# Patient Record
Sex: Male | Born: 1980 | Race: White | Hispanic: No | Marital: Single | Smoking: Never smoker
Health system: Southern US, Community
[De-identification: ages and names within clinical notes are randomized; demographics above are authoritative.]

## PROBLEM LIST (undated history)

## (undated) DIAGNOSIS — Q9389 Other deletions from the autosomes: Secondary | ICD-10-CM

## (undated) DIAGNOSIS — I890 Lymphedema, not elsewhere classified: Secondary | ICD-10-CM

## (undated) DIAGNOSIS — R4701 Aphasia: Secondary | ICD-10-CM

---

## 2012-09-13 ENCOUNTER — Emergency Department (HOSPITAL_BASED_OUTPATIENT_CLINIC_OR_DEPARTMENT_OTHER): Payer: Medicare Other

## 2012-09-13 ENCOUNTER — Emergency Department (HOSPITAL_BASED_OUTPATIENT_CLINIC_OR_DEPARTMENT_OTHER)
Admission: EM | Admit: 2012-09-13 | Discharge: 2012-09-13 | Disposition: A | Discharge status: Home / Self Care | Payer: Medicare Other | Attending: Emergency Medicine | Admitting: Emergency Medicine

## 2012-09-13 ENCOUNTER — Encounter (HOSPITAL_BASED_OUTPATIENT_CLINIC_OR_DEPARTMENT_OTHER): Payer: Self-pay | Admitting: Emergency Medicine

## 2012-09-13 DIAGNOSIS — R739 Hyperglycemia, unspecified: Secondary | ICD-10-CM

## 2012-09-13 DIAGNOSIS — R262 Difficulty in walking, not elsewhere classified: Secondary | ICD-10-CM | POA: Insufficient documentation

## 2012-09-13 DIAGNOSIS — Q9389 Other deletions from the autosomes: Secondary | ICD-10-CM | POA: Insufficient documentation

## 2012-09-13 DIAGNOSIS — R7309 Other abnormal glucose: Secondary | ICD-10-CM | POA: Insufficient documentation

## 2012-09-13 DIAGNOSIS — Q9388 Other microdeletions: Secondary | ICD-10-CM

## 2012-09-13 HISTORY — DX: Other deletions from the autosomes: Q93.89

## 2012-09-13 LAB — CBC WITH DIFFERENTIAL/PLATELET
Basophils Absolute: 0 10*3/uL (ref 0.0–0.1)
HCT: 40.5 % (ref 39.0–52.0)
Hemoglobin: 14.1 g/dL (ref 13.0–17.0)
Lymphocytes Relative: 5 % — ABNORMAL LOW (ref 12–46)
Lymphs Abs: 0.8 10*3/uL (ref 0.7–4.0)
Monocytes Absolute: 0.6 10*3/uL (ref 0.1–1.0)
Monocytes Relative: 4 % (ref 3–12)
Neutro Abs: 14.7 10*3/uL — ABNORMAL HIGH (ref 1.7–7.7)
RBC: 4.61 MIL/uL (ref 4.22–5.81)
RDW: 12.9 % (ref 11.5–15.5)
WBC: 16.1 10*3/uL — ABNORMAL HIGH (ref 4.0–10.5)

## 2012-09-13 LAB — COMPREHENSIVE METABOLIC PANEL
AST: 20 U/L (ref 0–37)
CO2: 24 mEq/L (ref 19–32)
Chloride: 101 mEq/L (ref 96–112)
Creatinine, Ser: 0.7 mg/dL (ref 0.50–1.35)
GFR calc non Af Amer: >90 mL/min (ref >90)
Glucose, Bld: 229 mg/dL — ABNORMAL HIGH (ref 70–99)
Total Bilirubin: 1 mg/dL (ref 0.3–1.2)

## 2012-09-13 LAB — CK TOTAL AND CKMB (NOT AT ARMC): Total CK: 119 U/L (ref 7–232)

## 2012-09-13 LAB — URINALYSIS, ROUTINE W REFLEX MICROSCOPIC
Bilirubin Urine: NEGATIVE
Hgb urine dipstick: NEGATIVE
Protein, ur: NEGATIVE mg/dL
Urobilinogen, UA: 0.2 mg/dL (ref 0.0–1.0)

## 2012-09-13 MED ORDER — POTASSIUM CHLORIDE 20 MEQ/15ML (10%) PO LIQD
40.0000 meq | Freq: Once | ORAL | Status: AC
  Administered 2012-09-13: 40 meq via ORAL
  Filled 2012-09-13: qty 30

## 2012-09-13 MED ORDER — ONDANSETRON 4 MG PO TBDP
4.0000 mg | ORAL_TABLET | Freq: Once | ORAL | Status: AC
  Administered 2012-09-13: 4 mg via ORAL
  Filled 2012-09-13: qty 1

## 2012-09-13 MED ORDER — ONDANSETRON 4 MG PO TBDP: 4.0000 mg | ORAL_TABLET | Freq: Three times a day (TID) | ORAL | Status: DC | PRN

## 2012-09-13 MED ORDER — MAGNESIUM SULFATE 50 % IJ SOLN: 1.0000 g | Freq: Once | INTRAMUSCULAR | Status: DC

## 2012-09-13 MED ORDER — POTASSIUM CHLORIDE CRYS ER 20 MEQ PO TBCR: 40.0000 meq | EXTENDED_RELEASE_TABLET | Freq: Once | ORAL | Status: DC

## 2012-09-13 MED ORDER — METFORMIN HCL 500 MG PO TABS: 500.0000 mg | ORAL_TABLET | Freq: Every day | ORAL | Status: DC

## 2012-09-13 MED ORDER — SODIUM CHLORIDE 0.9 % IV BOLUS (SEPSIS): 1000.0000 mL | Freq: Once | INTRAVENOUS | Status: DC

## 2012-09-13 MED ORDER — METFORMIN HCL 500 MG/5ML PO SOLN: 500.0000 mg | Freq: Every morning | ORAL | Status: DC

## 2012-09-13 NOTE — ED Notes (Signed)
MD at bedside. 

## 2012-09-13 NOTE — ED Notes (Signed)
Family at bedside attempting to get urine from pt.

## 2012-09-13 NOTE — ED Provider Notes (Signed)
History     CSN: [id number]  Arrival date & time 09/13/12  1048   First MD Initiated Contact with Patient 09/13/12 1059      Chief Complaint  Patient presents with  . 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 History is provided by patient's mother and other family members due to patient's significant deficits in expressive communication due to his chromosomal disorder. Pt's mother states that pt was pale and was having difficulty ambulating after walking out of the bathroom. Pt's mother had to "catch" patient to prevent him from falling, and states patient has been unable to ambulate independently since that time. Family members deny pt having any recent symptoms of illness, or of him complaining of being in any pain or discomfort. When asked specifically if he is in any pain, pt shakes his head in a "no" fashion.   Past Medical History  Diagnosis Date  . 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  No family history on file.  History  Substance Use Topics  . Smoking status: Never Smoker   . Smokeless tobacco: Not on file  . Alcohol Use: No      Review of Systems  All other systems reviewed and are negative.    Allergies  Review of patient's allergies indicates no known allergies.  Home Medications   Current Outpatient Rx  Name  Route  Sig  Dispense  Refill  . METFORMIN HCL 500 MG/5ML PO SOLN   Oral   Take 5 mLs (500 mg total) by mouth every morning.   300 mL   0   . ONDANSETRON 4 MG PO TBDP   Oral   Take 1 tablet (4 mg total) by mouth every 8 (eight) hours as needed for nausea.   20 tablet   0     BP 132/76  Pulse 76  Temp 98.2 F (36.8 C) (Oral)  Resp 16  SpO2 100%  Physical Exam  Constitutional: No distress.  HENT:  Head: Atraumatic.  Eyes: Pupils are equal, round,  and reactive to light. No scleral icterus.  Neck: Normal range of motion. No tracheal deviation present.  Cardiovascular: Normal rate and regular rhythm.   No murmur heard. Pulmonary/Chest: Effort normal. He has no wheezes. He has no rales.  Abdominal: Soft. Bowel sounds are normal. He exhibits no distension. There is no tenderness.  Musculoskeletal: Normal range of motion. He exhibits edema (bilateral non-pitting LE edema consistent with lymphedema).  Neurological: He is alert.       Patient unable to communicate verbally (at baseline). Strength decreased (likely due to effort) but equal in all extremities. CN II-XII intact. No focal deficits. 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   Skin: Skin is warm and dry. No rash noted.    ED Course  Procedures (including critical care time)  Labs Reviewed  CBC WITH DIFFERENTIAL - Abnormal; Notable for the following:    WBC 16.1 (*)     Neutrophils Relative 91 (*)     Neutro Abs 14.7 (*)     Lymphocytes Relative 5 (*)     All other components within normal limits  COMPREHENSIVE METABOLIC PANEL - Abnormal; Notable for the following:    Potassium 3.3 (*)     Glucose, Bld 229 (*)     All other components within normal limits  URINALYSIS, ROUTINE W REFLEX MICROSCOPIC - Abnormal; Notable for  the following:    Color, Urine AMBER (*)  BIOCHEMICALS MAY BE AFFECTED BY COLOR   Specific Gravity, Urine 1.040 (*)     Glucose, UA 500 (*)     Ketones, ur 40 (*)     All other components within normal limits  CK TOTAL AND CKMB - Abnormal; Notable for the following:    Relative Index 3.0 (*)     All other components within normal limits  HEMOGLOBIN A1C   Dg Chest 2 View  09/13/2012  *RADIOLOGY REPORT*  Clinical Data: Weakness, leukocytosis  CHEST - 2 VIEW  Comparison:  None.  Findings:  The heart size and mediastinal contours are within normal limits.  Both lungs are clear.  The visualized skeletal structures are unremarkable.  IMPRESSION: No active  cardiopulmonary disease.   Original Report Authenticated By: Judie Petit. Miles Costain, M.D.    Ct Head Wo Contrast  09/13/2012  *RADIOLOGY REPORT*  Clinical Data: Extremity weakness and decreased mobility.  CT HEAD WITHOUT CONTRAST  Technique:  Contiguous axial images were obtained from the base of the skull through the vertex without contrast.  Comparison: None.  Findings: The brain shows greater degree of atrophy than expected for age.  There is no evidence of hemorrhage, infarction, edema, mass effect, extra-axial fluid collection, hydrocephalus or mass lesion.  The skull is unremarkable.  The right maxillary antrum is opacified and a few ethmoid air cells also show opacification.  IMPRESSION:  1.  Cerebral atrophy. 2.  No acute findings. 3.  Paranasal sinus disease as above.   Original Report Authenticated By: Irish Lack, M.D.      1. Chromosome 22q13 microdeletion syndrome   2. Hyperglycemia       MDM  Pt appears to be hypovolemic secondary to likely DM, with fasting CBG = 229. Hb A1c results pending. 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 Patient vomited once after attempting to rehydrate too quickly with PO fluids, and was unable to tolerate zofran ODT. Later in ED course he was able to hold down some liquids, and was able to ambulate with minimal assistance. At that time pt was deemed appropriate for discharge with instructions to establish care and fu with a PCP within the following week. Pt was started on metformin 500mg  QD for his very likely DM, and will likely need to continue anti-hyperglycemic treatment going forward unless the results of his Hb A1c are within normal limits. Pt stable at time of discharge, and although pt has leukocytosis there are no other signs/symptoms concerning for infection, thus his leukocytosis likely more consistent with a stress response.          Elfredia Nevins, MD 09/13/12 [phone]

## 2012-09-13 NOTE — ED Notes (Signed)
Per EMS:  Pt from home, decreased mobility since this am.  Pt has severe lower extremity swelling which is baseline for pt.  Pt was leaning at home, unable to walk except with maximum assist which is new for pt.  No recent illness or injury.

## 2012-09-13 NOTE — ED Notes (Signed)
Pt removed IV after placement.

## 2012-09-14 LAB — HEMOGLOBIN A1C: Mean Plasma Glucose: 114 mg/dL (ref <117)

## 2012-09-14 NOTE — ED Provider Notes (Signed)
I saw and evaluated the patient, reviewed the resident's note and I agree with the findings and plan.   .Face to face Exam:  General:  Awake HEENT:  Atraumatic Resp:  Normal effort Abd:  Nondistended Neuro:No focal weakness Lymph: No adenopathy   Nelia Shi, MD 09/14/12 [phone]

## 2015-12-03 ENCOUNTER — Encounter (HOSPITAL_BASED_OUTPATIENT_CLINIC_OR_DEPARTMENT_OTHER): Payer: Self-pay | Admitting: *Deleted

## 2015-12-03 ENCOUNTER — Emergency Department (HOSPITAL_BASED_OUTPATIENT_CLINIC_OR_DEPARTMENT_OTHER): Payer: Medicare Other

## 2015-12-03 ENCOUNTER — Emergency Department (HOSPITAL_BASED_OUTPATIENT_CLINIC_OR_DEPARTMENT_OTHER)
Admission: EM | Admit: 2015-12-03 | Discharge: 2015-12-03 | Disposition: A | Discharge status: Home / Self Care | Payer: Medicare Other | Attending: Emergency Medicine | Admitting: Emergency Medicine

## 2015-12-03 DIAGNOSIS — R269 Unspecified abnormalities of gait and mobility: Secondary | ICD-10-CM | POA: Diagnosis not present

## 2015-12-03 DIAGNOSIS — M19031 Primary osteoarthritis, right wrist: Secondary | ICD-10-CM

## 2015-12-03 DIAGNOSIS — F419 Anxiety disorder, unspecified: Secondary | ICD-10-CM | POA: Insufficient documentation

## 2015-12-03 DIAGNOSIS — M25531 Pain in right wrist: Secondary | ICD-10-CM | POA: Diagnosis present

## 2015-12-03 DIAGNOSIS — Q9389 Other deletions from the autosomes: Secondary | ICD-10-CM | POA: Diagnosis not present

## 2015-12-03 MED ORDER — IBUPROFEN 100 MG/5ML PO SUSP
600.0000 mg | Freq: Once | ORAL | Status: AC
  Administered 2015-12-03: 600 mg via ORAL
  Filled 2015-12-03: qty 30

## 2015-12-03 NOTE — ED Provider Notes (Signed)
CSN: [id number]     Arrival date & time 12/03/15  1512 History  By signing my name below, I, Willie Ayala, attest that this documentation has been prepared under the direction and in the presence of Willie Rice, MD. Electronically Signed: Helane Ayala, ED Scribe. 12/03/2015. 4:03 PM.     Chief Complaint  Patient presents with  . Hand Injury   The history is provided by a parent. No language interpreter was used.   HPI Comments: Level 5 Caveat (pt is non-verbal) Willie Ayala is a 35 y.o. 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 Per mom, she noticed pt seemed to have difficulty using his right hand to eat, and states pt then switched to using only his left hand for eating and other tasks, which is unusual for him. She reports no other neurological symptoms or motor difficulties. She notes pt does not have a neurologist and is very healthy otherwise. She states this would be pt's first neurological issue, if it is not an injury. She notes pt has edema and an awkward gait at baseline, and states pt is very self-sufficient otherwise. She denies noticing any bruising, abnormal swelling, or other signs of trauma on the hand.   Past Medical History  Diagnosis Date  . 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 No pertinent past surgical history. History reviewed. No pertinent family history. Social History  Substance Use Topics  . Smoking status: Never Smoker   . Smokeless tobacco: None  . Alcohol Use: No    Review of Systems  Unable to perform ROS: Patient nonverbal  Constitutional: Negative for chills.  Musculoskeletal: Positive for gait problem (baseline).  Skin: Negative for color change.    Allergies  Review of patient's allergies indicates no known allergies.  Home Medications   Prior to Admission medications    Not on File   BP 129/87 mmHg  Pulse 83  Resp 20  SpO2 100% Physical Exam  Constitutional: He appears well-developed and well-nourished. No distress.  HENT:  Head: Normocephalic and atraumatic.  Mouth/Throat: Oropharynx is clear and moist.  Deep set eyes  Eyes: EOM are normal. Pupils are equal, round, and reactive to light.  Neck: Normal range of motion. Neck supple.  Cardiovascular: Normal rate and regular rhythm.   Pulmonary/Chest: Effort normal and breath sounds normal.  Abdominal: Soft. Bowel sounds are normal. There is no tenderness.  Musculoskeletal: Normal range of motion. He exhibits no edema or tenderness.  Patient is resistant to exam. He does not seem to be moving the right wrist and hand especially as the left. There is no obvious tenderness to palpation. No obvious deformity or swelling to the wrist or hand. Good distal cap refill.  Neurological: He is alert.  Patient is intermittently following simple commands. He is ambulating with abnormal gait but independently. He has good grip strength with the left hand. He is not moving the right hand  Skin: Skin is warm and dry. No rash noted. No erythema.  Psychiatric:  Anxious, poor eye contact.  Nursing note and vitals reviewed.   ED Course  Procedures  DIAGNOSTIC STUDIES: Oxygen Saturation is 100% on RA, normal by my interpretation.    COORDINATION OF CARE: 3:58 PM - Discussed plans to order diagnostic studies to r/o an injury. Parent advised of plan for treatment and  parent agrees.  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 Initial encounter. EXAM: RIGHT WRIST - COMPLETE 3+ VIEW COMPARISON:  Right hand x-rays obtained concurrently. No prior wrist imaging.  FINDINGS: Mild diffuse soft tissue swelling. No evidence of acute fracture or dislocation. Mild joint space narrowing involving the trapezium-lunate joint. Remaining joint spaces well preserved. Bone mineral density well-preserved. IMPRESSION: No acute or significant osseous abnormality. Electronically Signed   By: Willie Ayala M.D.   On: 12/03/2015 16:33   Dg Hand Complete Right  12/03/2015  CLINICAL DATA:  Possible right hand injury EXAM: RIGHT HAND - COMPLETE 3+ VIEW COMPARISON:  None. FINDINGS: No fracture or dislocation is seen. The joint spaces are preserved. The visualized soft tissues are unremarkable. IMPRESSION: No acute osseus abnormality is seen. Electronically Signed   By: Willie Ayala M.D.   On: 12/03/2015 16:30   I have personally reviewed and evaluated these images and lab results as part of my medical decision-making.   EKG Interpretation None      MDM   Final diagnoses:  Arthritis of right wrist   I personally performed the services described in this documentation, which was scribed in my presence. The recorded information has been reviewed and is accurate.   Soft tissue swelling noted on wrist x-ray as well as narrowing of the trapezium-lunate joint. Given these findings are likely musculoskeletal cause for the patient's non-use as opposed to neurologic. We'll placed in a wrist splint and referred to hand surgery. Will also start on ibuprofen. Return precautions given.  Willie Rice, MD 12/03/15 1659

## 2015-12-03 NOTE — ED Notes (Signed)
Inability to move right hand as he usually does since Wednesday.  Pt nonverbal. No redness, swelling noted.

## 2015-12-03 NOTE — Discharge Instructions (Signed)

## 2017-02-27 ENCOUNTER — Encounter (HOSPITAL_BASED_OUTPATIENT_CLINIC_OR_DEPARTMENT_OTHER): Payer: Self-pay

## 2017-02-27 ENCOUNTER — Emergency Department (HOSPITAL_BASED_OUTPATIENT_CLINIC_OR_DEPARTMENT_OTHER)
Admission: EM | Admit: 2017-02-27 | Discharge: 2017-02-27 | Disposition: A | Discharge status: Home / Self Care | Payer: Medicare Other | Attending: Emergency Medicine | Admitting: Emergency Medicine

## 2017-02-27 DIAGNOSIS — J020 Streptococcal pharyngitis: Secondary | ICD-10-CM | POA: Diagnosis not present

## 2017-02-27 DIAGNOSIS — R1310 Dysphagia, unspecified: Secondary | ICD-10-CM | POA: Diagnosis present

## 2017-02-27 HISTORY — DX: Aphasia: R47.01

## 2017-02-27 LAB — RAPID STREP SCREEN (MED CTR MEBANE ONLY): Streptococcus, Group A Screen (Direct): POSITIVE — AB

## 2017-02-27 MED ORDER — PENICILLIN V POTASSIUM 250 MG/5ML PO SOLR: 500.0000 mg | Freq: Three times a day (TID) | ORAL | 0 refills | Status: AC

## 2017-02-27 NOTE — ED Provider Notes (Signed)
[place] [facility] Provider Note   CSN: [id number] Arrival date & time: 02/27/17  1720  By signing my name below, I, Higinio Plan, attest that this documentation has been prepared under the direction and in the presence of Dorie Rank, MD . Electronically Signed: Higinio Plan, Scribe. 02/27/2017. 5:52 PM.  History   Chief Complaint No chief complaint on file.  The history is provided by the patient. No language interpreter was used.   HPI Comments: Willie Ayala is a 36 y.o. 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 Pt's mother reports pt ate his breakfast "slower than normal" this morning and did not eat his entire lunch which is abnormal for him. She notes associated subjective neck swelling described as a "golf ball around his adam's apple." Pt's mother denies any decreased appetite, shortness of breath, fever, cough, or vomiting.    Past Medical History:  Diagnosis Date  . Nonverbal   . 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  History reviewed. No pertinent surgical history.  Home Medications    Prior to Admission medications   Medication Sig Start Date End Date Taking? Authorizing Provider  penicillin v potassium (VEETID) 250 MG/5ML solution Take 10 mLs (500 mg total) by mouth 3 (three) times daily. 02/27/17 03/09/17  Dorie Rank, MD    Family History No family history on file.  Social History Social History  Substance Use Topics  . Smoking status: Never Smoker  . Smokeless tobacco: Never Used  . Alcohol use No   Allergies   Patient has no known allergies.  Review of Systems Review of Systems  Constitutional: Negative for appetite change and fever.  HENT: Positive for trouble swallowing.   Respiratory: Negative for cough and shortness of breath.   Gastrointestinal: Negative for  vomiting.  Musculoskeletal:       +subjective neck swelling   Physical Exam Updated Vital Signs BP 126/85 (BP Location: Left Arm)   Pulse 80   Temp 98.6 F (37 C) (Oral)   Resp 16   Wt 131 lb (59.4 kg)   SpO2 100%   Physical Exam  Constitutional: He appears well-nourished. No distress.  HENT:  Head: Normocephalic and atraumatic.  Right Ear: Tympanic membrane and external ear normal.  Left Ear: Tympanic membrane and external ear normal.  Mouth/Throat: Oropharynx is clear and moist. No oropharyngeal exudate.  Difficult to get pt to completely open his mouth, view is somewhat limited. No stridor, no difficulty handling his secretions   Eyes: Conjunctivae are normal. Right eye exhibits no discharge. Left eye exhibits no discharge. No scleral icterus.  Neck: Neck supple. No tracheal deviation present.  Prominent tracheal cartilage without erythema or edema, no tenderness  Cardiovascular: Normal rate.   Pulmonary/Chest: Effort normal. No stridor. No respiratory distress.  Abdominal: He exhibits no distension.  Musculoskeletal: He exhibits no edema.  Neurological: He is alert. Cranial nerve deficit: no gross deficits.  Skin: Skin is warm and dry. No rash noted.  Psychiatric: He has a normal mood and affect.  Nursing note and vitals reviewed.  ED Treatments / Results  DIAGNOSTIC STUDIES:  Oxygen Saturation is 100% on RA, normal by my interpretation.    COORDINATION OF CARE:  5:47 PM Discussed treatment plan with pt and his mother at bedside and they agreed to plan.  Labs (all labs  ordered are listed, but only abnormal results are displayed) Labs Reviewed  RAPID STREP SCREEN (NOT AT [facility]) - Abnormal; Notable for the following:       Result Value   Streptococcus, Group A Screen (Direct) POSITIVE (*)    All other components within normal limits   Radiology No results found.  Procedures Procedures (including critical care time)  Medications Ordered in ED Medications - No  data to display  Initial Impression / Assessment and Plan / ED Course  I have reviewed the triage vital signs and the nursing notes.  Pertinent labs & imaging results that were available during my care of the patient were reviewed by me and considered in my medical decision making (see chart for details).   Strep test positive.  Pt does not appear to be in any distress.  Will rx penicillin po  Final Clinical Impressions(s) / ED Diagnoses   Final diagnoses:  Strep throat    New Prescriptions New Prescriptions   PENICILLIN V POTASSIUM (VEETID) 250 MG/5ML SOLUTION    Take 10 mLs (500 mg total) by mouth 3 (three) times daily.   I personally performed the services described in this documentation, which was scribed in my presence.  The recorded information has been reviewed and is accurate.    Dorie Rank, MD 02/27/17 [phone]

## 2017-02-27 NOTE — ED Notes (Signed)
ED Provider at bedside. 

## 2017-02-27 NOTE — ED Triage Notes (Signed)
Mother reports pt with slow to swallow food today and she feels there is "swelling to adam's apple"-denies injury-pt nonverbal-NAD-slow steady gait

## 2017-02-27 NOTE — Discharge Instructions (Signed)
Take antibiotics as prescribed, follow-up with his doctor if the symptoms have not resolved after the course of antibiotics

## 2017-03-16 ENCOUNTER — Emergency Department (HOSPITAL_BASED_OUTPATIENT_CLINIC_OR_DEPARTMENT_OTHER)
Admission: EM | Admit: 2017-03-16 | Discharge: 2017-03-16 | Disposition: A | Discharge status: Home / Self Care | Payer: Medicare Other | Attending: Emergency Medicine | Admitting: Emergency Medicine

## 2017-03-16 ENCOUNTER — Encounter (HOSPITAL_BASED_OUTPATIENT_CLINIC_OR_DEPARTMENT_OTHER): Payer: Self-pay | Admitting: Emergency Medicine

## 2017-03-16 ENCOUNTER — Emergency Department (HOSPITAL_BASED_OUTPATIENT_CLINIC_OR_DEPARTMENT_OTHER): Payer: Medicare Other

## 2017-03-16 DIAGNOSIS — K219 Gastro-esophageal reflux disease without esophagitis: Secondary | ICD-10-CM | POA: Insufficient documentation

## 2017-03-16 DIAGNOSIS — Q932 Chromosome replaced with ring, dicentric or isochromosome: Secondary | ICD-10-CM | POA: Insufficient documentation

## 2017-03-16 DIAGNOSIS — R4182 Altered mental status, unspecified: Secondary | ICD-10-CM | POA: Diagnosis present

## 2017-03-16 LAB — CBC WITH DIFFERENTIAL/PLATELET
Basophils Absolute: 0 10*3/uL (ref 0.0–0.1)
Basophils Relative: 0 %
EOS ABS: 0.1 10*3/uL (ref 0.0–0.7)
EOS PCT: 1 %
HCT: 44 % (ref 39.0–52.0)
Hemoglobin: 14.7 g/dL (ref 13.0–17.0)
LYMPHS ABS: 1.4 10*3/uL (ref 0.7–4.0)
Lymphocytes Relative: 14 %
MCH: 31.4 pg (ref 26.0–34.0)
MCHC: 33.4 g/dL (ref 30.0–36.0)
MCV: 94 fL (ref 78.0–100.0)
MONO ABS: 1 10*3/uL (ref 0.1–1.0)
MONOS PCT: 10 %
NEUTROS PCT: 75 %
Neutro Abs: 7.5 10*3/uL (ref 1.7–7.7)
PLATELETS: 257 10*3/uL (ref 150–400)
RBC: 4.68 MIL/uL (ref 4.22–5.81)
RDW: 13.4 % (ref 11.5–15.5)
WBC: 10 10*3/uL (ref 4.0–10.5)

## 2017-03-16 LAB — BASIC METABOLIC PANEL
Anion gap: 10 (ref 5–15)
BUN: 21 mg/dL — AB (ref 6–20)
CALCIUM: 9.8 mg/dL (ref 8.9–10.3)
CO2: 26 mmol/L (ref 22–32)
CREATININE: 0.81 mg/dL (ref 0.61–1.24)
Chloride: 107 mmol/L (ref 101–111)
GFR calc non Af Amer: >60 mL/min (ref >60)
GLUCOSE: 97 mg/dL (ref 65–99)
Potassium: 3.9 mmol/L (ref 3.5–5.1)
Sodium: 143 mmol/L (ref 135–145)

## 2017-03-16 MED ORDER — SODIUM CHLORIDE 0.9 % IV BOLUS (SEPSIS)
500.0000 mL | Freq: Once | INTRAVENOUS | Status: AC
  Administered 2017-03-16: 500 mL via INTRAVENOUS

## 2017-03-16 NOTE — ED Provider Notes (Signed)
[place] [facility] Provider Note   CSN: [id number] Arrival date & time: 03/16/17  1555   By signing my name below, I, Mayer Masker, attest that this documentation has been prepared under the direction and in the presence of Yazeed Pryer PA-C. Electronically Signed: Mayer Masker, Scribe. 03/16/17. 4:45 PM.  History   Chief Complaint Chief Complaint  Patient presents with  . Altered Mental Status   The history is provided by a parent. No language interpreter was used.   HPI Comments: Rosbel Buckner is a 36 y.o. 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 Pt is nonverbal, which is his baseline. His mother states he has associated sweating and hyperactivity which he does experience intermittently, but she states that this time it lasts longer than usual. She notes he was acting strangely which includes pacing, yelling, and burping and feeling like he is, "bring something up." She denies cough, changes to his appetite, diarrhea, constipation, vomiting, or fever. She notes he had a recent diagnosis of strep throat for which he was prescribed penicillin. He finished his penicillin prescription last weekend. He is not on any medications and denies other health issues.  Past Medical History:  Diagnosis Date  . Nonverbal   . 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   History reviewed. No pertinent surgical history.     Home Medications    Prior to Admission medications   Not on File    Family History No family history on file.  Social History Social History  Substance Use Topics  . Smoking status: Never Smoker  . Smokeless tobacco: Never Used  . Alcohol use No     Allergies   Patient has no known allergies.   Review of Systems Review of Systems  Constitutional: Positive for diaphoresis. Negative  for appetite change and fever.  HENT: Negative for drooling.   Respiratory: Positive for cough.   Gastrointestinal: Negative for constipation, diarrhea and vomiting.  Genitourinary: Negative for hematuria and urgency.  Neurological: Negative for syncope.  Psychiatric/Behavioral: Positive for behavioral problems. The patient is hyperactive.      Physical Exam Updated Vital Signs BP (!) 155/102 (BP Location: Right Arm)   Pulse 97   Temp 99.3 F (37.4 C) (Oral)   Resp 18   SpO2 100%   Physical Exam  Constitutional: He appears well-developed and well-nourished. No distress.  Patient is nonverbal. He appears comfortable and pleasant.  HENT:  Head: Normocephalic and atraumatic.  Right Ear: Tympanic membrane normal.  Left Ear: Tympanic membrane normal.  Nose: Nose normal.  Mouth/Throat: Uvula is midline, oropharynx is clear and moist and mucous membranes are normal.  Eyes: Conjunctivae and EOM are normal. Right eye exhibits no discharge. Left eye exhibits no discharge. No scleral icterus.  Neck: Normal range of motion. Neck supple.  Cardiovascular: Normal rate, regular rhythm, normal heart sounds and intact distal pulses.  Exam reveals no gallop and no friction rub.   No murmur heard. Pulmonary/Chest: Effort normal and breath sounds normal. No respiratory distress.  Abdominal: Soft. Bowel sounds are normal. He exhibits no distension. There is no tenderness. There is no guarding.  Neurological: He is alert.  Skin: Skin is warm and dry. No rash noted.  Psychiatric: He has a normal mood and affect.  Nursing note and vitals reviewed.    ED Treatments /  Results  DIAGNOSTIC STUDIES: Oxygen Saturation is 100% on RA, normal by my interpretation.    COORDINATION OF CARE: 4:42 PM Discussed treatment plan with pt at bedside and pt agreed to plan.  Labs (all labs ordered are listed, but only abnormal results are displayed) Labs Reviewed  BASIC METABOLIC PANEL - Abnormal; Notable for  the following:       Result Value   BUN 21 (*)    All other components within normal limits  CBC WITH DIFFERENTIAL/PLATELET  URINALYSIS, ROUTINE W REFLEX MICROSCOPIC    EKG  EKG Interpretation None       Radiology Dg Chest 2 View  Result Date: 03/16/2017 CLINICAL DATA:  Chronic belching for 2 weeks. EXAM: CHEST  2 VIEW COMPARISON:  09/13/2012 FINDINGS: The heart size and mediastinal contours are within normal limits. Both lungs are clear. The visualized skeletal structures are unremarkable. IMPRESSION: No active cardiopulmonary disease. Electronically Signed   By: Kathreen Devoid   On: 03/16/2017 17:11    Procedures Procedures (including critical care time)  Medications Ordered in ED Medications  sodium chloride 0.9 % bolus 500 mL (500 mLs Intravenous New Bag/Given 03/16/17 1836)     Initial Impression / Assessment and Plan / ED Course  I have reviewed the triage vital signs and the nursing notes.  Pertinent labs & imaging results that were available during my care of the patient were reviewed by me and considered in my medical decision making (see chart for details).     Patient presents with complaints of "not acting like himself" as he is burping, yelling, pacing which is normal for him but does not usually last this long. No significant abnormalities noted on physical exam. Vital signs normal, and patient does not appear in respiratory distress and is tolerating secretions. Chest x-ray was obtained and showed no active cardiopulmonary disease. CBC and BMP unremarkable today. Patient is not complaining of any urinary symptoms so urinalysis was canceled. Symptoms could be due to reflux as patient states it is worse at night and he is uncomfortable when he sleeps. No other complaints noted at this time. Patient appears stable for discharge. Advised to take Maalox or probiotic as needed and follow-up with PCP. Should return precautions given.  Final Clinical Impressions(s) / ED  Diagnoses   Final diagnoses:  Gastroesophageal reflux disease without esophagitis    New Prescriptions New Prescriptions   No medications on file   I personally performed the services described in this documentation, which was scribed in my presence. The recorded information has been reviewed and is accurate.     Delia Heady, PA-C 03/16/17 Achille Rich, MD 03/16/17 [phone]

## 2017-03-16 NOTE — Discharge Instructions (Signed)
Take Maalox and probiotic as directed. Follow-up with PCP for further evaluation. Return to ED for worsening symptoms, trouble breathing, trouble swallowing, fever, vomiting.

## 2017-03-16 NOTE — ED Triage Notes (Signed)
Pt is nonverbal. Presents with mother who states he is acting "strange". He was recently tx for strep throat. He has been diaphoretic and yelling out, he did not sleep well last night. Pt ate and drank normally today, urinated well today.

## 2017-03-16 NOTE — ED Notes (Signed)
Pt unable to provide urine after several attempts. PA/MD aware. Ok to d/c pt.

## 2017-03-16 NOTE — ED Notes (Signed)
Pt's mother given d/c instructions as per chart. Verbalizes understanding. No questions. 

## 2017-03-16 NOTE — ED Notes (Signed)
Pt's mother also states he has had increased "burping" since he has been altered. The altered behavior has been happening late at night.

## 2017-03-31 ENCOUNTER — Emergency Department (HOSPITAL_BASED_OUTPATIENT_CLINIC_OR_DEPARTMENT_OTHER)
Admission: EM | Admit: 2017-03-31 | Discharge: 2017-03-31 | Disposition: A | Discharge status: Home / Self Care | Payer: Medicare Other | Attending: Emergency Medicine | Admitting: Emergency Medicine

## 2017-03-31 ENCOUNTER — Emergency Department (HOSPITAL_BASED_OUTPATIENT_CLINIC_OR_DEPARTMENT_OTHER): Payer: Medicare Other

## 2017-03-31 ENCOUNTER — Encounter (HOSPITAL_BASED_OUTPATIENT_CLINIC_OR_DEPARTMENT_OTHER): Payer: Self-pay | Admitting: Emergency Medicine

## 2017-03-31 DIAGNOSIS — R2242 Localized swelling, mass and lump, left lower limb: Secondary | ICD-10-CM | POA: Insufficient documentation

## 2017-03-31 DIAGNOSIS — M7989 Other specified soft tissue disorders: Secondary | ICD-10-CM

## 2017-03-31 LAB — CBC WITH DIFFERENTIAL/PLATELET
BASOS PCT: 0 %
Basophils Absolute: 0 10*3/uL (ref 0.0–0.1)
EOS ABS: 0.2 10*3/uL (ref 0.0–0.7)
EOS PCT: 2 %
HCT: 40.3 % (ref 39.0–52.0)
Hemoglobin: 13.3 g/dL (ref 13.0–17.0)
LYMPHS ABS: 1.5 10*3/uL (ref 0.7–4.0)
LYMPHS PCT: 15 %
MCH: 31.2 pg (ref 26.0–34.0)
MCHC: 33 g/dL (ref 30.0–36.0)
MCV: 94.6 fL (ref 78.0–100.0)
MONOS PCT: 12 %
Monocytes Absolute: 1.1 10*3/uL — ABNORMAL HIGH (ref 0.1–1.0)
NEUTROS PCT: 71 %
Neutro Abs: 6.8 10*3/uL (ref 1.7–7.7)
PLATELETS: 210 10*3/uL (ref 150–400)
RBC: 4.26 MIL/uL (ref 4.22–5.81)
RDW: 13.1 % (ref 11.5–15.5)
WBC: 9.6 10*3/uL (ref 4.0–10.5)

## 2017-03-31 LAB — COMPREHENSIVE METABOLIC PANEL
ALBUMIN: 3.7 g/dL (ref 3.5–5.0)
ALT: 28 U/L (ref 17–63)
AST: 36 U/L (ref 15–41)
Alkaline Phosphatase: 49 U/L (ref 38–126)
Anion gap: 7 (ref 5–15)
BUN: 22 mg/dL — ABNORMAL HIGH (ref 6–20)
CHLORIDE: 101 mmol/L (ref 101–111)
CO2: 29 mmol/L (ref 22–32)
Calcium: 9.1 mg/dL (ref 8.9–10.3)
Creatinine, Ser: 0.74 mg/dL (ref 0.61–1.24)
GFR calc Af Amer: >60 mL/min (ref >60)
Glucose, Bld: 88 mg/dL (ref 65–99)
POTASSIUM: 4.1 mmol/L (ref 3.5–5.1)
Sodium: 137 mmol/L (ref 135–145)
Total Bilirubin: 3 mg/dL — ABNORMAL HIGH (ref 0.3–1.2)
Total Protein: 6.7 g/dL (ref 6.5–8.1)

## 2017-03-31 MED ORDER — CEPHALEXIN 500 MG PO CAPS: 500.0000 mg | ORAL_CAPSULE | Freq: Four times a day (QID) | ORAL | 0 refills | Status: DC

## 2017-03-31 NOTE — ED Notes (Signed)
Pt transported to US/XR

## 2017-03-31 NOTE — ED Triage Notes (Addendum)
Pt is non verbal. L leg swelling and redness, hot to touch per mother. Pt normally has swelling to R leg. This is new per mother.

## 2017-03-31 NOTE — ED Provider Notes (Signed)
[facility] DEPT [facility] Provider Note   CSN: [id number] Arrival date & time: 03/31/17  1344     History   Chief Complaint Chief Complaint  Patient presents with  . Leg Swelling    HPI Willie Ayala is a 36 y.o. male.  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 Swelling is been going on for about a week. Noted to be red and hot this morning. Denies fevers but even when infected usually does not have a fever. The patient is chronically aphasic. Level V caveat aphasia.  Per the mother he has lymphedema to the right lower extremity but never had it to the left. Started having significant swelling to the left starting about a week. Has been pacing at night 7 little bit more irritable. She thinks he likely has been having pain that is hard to discern. Was recently diagnosed with strep throat a couple weeks ago. The mother is been looking at possible etiologies on the Internet and thinks that maybe he has lymphangitis or a poststreptococcal syndrome.   The history is provided by the patient.  Illness  This is a new problem. The current episode started less than 1 hour ago. The problem occurs constantly. The problem has not changed since onset.Pertinent negatives include no chest pain, no abdominal pain, no headaches and no shortness of breath. Nothing aggravates the symptoms. Nothing relieves the symptoms. He has tried nothing for the symptoms. The treatment provided no relief.    Past Medical History:  Diagnosis Date  . Nonverbal   . 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   History reviewed. No pertinent surgical history.     Home Medications    Prior to Admission medications   Medication Sig Start Date End Date Taking? Authorizing Provider  cephALEXin (KEFLEX) 500 MG capsule Take 1 capsule (500 mg total) by mouth  4 (four) times daily. 03/31/17   Deno Etienne, DO    Family History No family history on file.  Social History Social History  Substance Use Topics  . Smoking status: Never Smoker  . Smokeless tobacco: Never Used  . Alcohol use No     Allergies   Patient has no known allergies.   Review of Systems Review of Systems  Constitutional: Negative for chills and fever.  HENT: Negative for congestion and facial swelling.   Eyes: Negative for discharge and visual disturbance.  Respiratory: Negative for shortness of breath.   Cardiovascular: Negative for chest pain and palpitations.  Gastrointestinal: Negative for abdominal pain, diarrhea and vomiting.  Musculoskeletal: Negative for arthralgias and myalgias.  Skin: Positive for color change and rash.  Neurological: Negative for tremors, syncope and headaches.  Psychiatric/Behavioral: Negative for confusion and dysphoric mood.     Physical Exam Updated Vital Signs BP 139/81 (BP Location: Right Arm)   Pulse 74   Temp 99.1 F (37.3 C) (Oral)   Resp 18   SpO2 98%   Physical Exam  Constitutional: He appears well-developed and well-nourished.  HENT:  Head: Normocephalic and atraumatic.  Eyes: EOM are normal. Pupils are equal, round, and reactive to light.  Neck: Normal range of motion. Neck supple. No JVD present.  Cardiovascular: Normal rate and regular rhythm.  Exam reveals no gallop and no friction rub.   No murmur heard. Pulmonary/Chest: No respiratory distress. He has no  wheezes.  Abdominal: He exhibits no distension and no mass. There is no tenderness. There is no rebound and no guarding.  Musculoskeletal: Normal range of motion. He exhibits edema. He exhibits no tenderness.  Bilateral likely lymphedema. Erythema to the left lower extremity below the knee. 4+ pitting up to the lower thigh.  Neurological: He is alert.  Skin: No rash noted. No pallor.  Nursing note and vitals reviewed.    ED Treatments / Results   Labs (all labs ordered are listed, but only abnormal results are displayed) Labs Reviewed  CBC WITH DIFFERENTIAL/PLATELET - Abnormal; Notable for the following:       Result Value   Monocytes Absolute 1.1 (*)    All other components within normal limits  COMPREHENSIVE METABOLIC PANEL - Abnormal; Notable for the following:    BUN 22 (*)    Total Bilirubin 3.0 (*)    All other components within normal limits  URINALYSIS, ROUTINE W REFLEX MICROSCOPIC    EKG  EKG Interpretation None       Radiology No results found.  Procedures Procedures (including critical care time)  Medications Ordered in ED Medications - No data to display   Initial Impression / Assessment and Plan / ED Course  I have reviewed the triage vital signs and the nursing notes.  Pertinent labs & imaging results that were available during my care of the patient were reviewed by me and considered in my medical decision making (see chart for details).     36 yo M With a chief complaint of left lower extremity pain and swelling. Warm to touch compared to the right. I suspect this is cellulitis. However it's difficult to obtain any history from the patient. Will obtain a plain film to rule out fracture as well as a DVT study and labs.  Labs with a total bili of 3 otherwise unremarkable. Awaiting imaging studies.  If negative will likely be discharged. Will start on Keflex for possible cellulitis.  The patients results and plan were reviewed and discussed.   Any x-rays performed were independently reviewed by myself.   Differential diagnosis were considered with the presenting HPI.  Medications - No data to display  Vitals:   03/31/17 1351  BP: 139/81  Pulse: 74  Resp: 18  Temp: 99.1 F (37.3 C)  TempSrc: Oral  SpO2: 98%    Final diagnoses:  Leg swelling       Final Clinical Impressions(s) / ED Diagnoses   Final diagnoses:  Leg swelling    New Prescriptions New Prescriptions    CEPHALEXIN (KEFLEX) 500 MG CAPSULE    Take 1 capsule (500 mg total) by mouth 4 (four) times daily.     Deno Etienne, DO 03/31/17 [phone]

## 2017-03-31 NOTE — Discharge Instructions (Addendum)
You have been seen today in the Emergency Department (ED) for cellulitis, a superficial skin infection. Please take your antibiotics as prescribed for their ENTIRE prescribed duration.  Take Tylenol or Motrin as needed for pain, but only as written on the box.  ° °Please follow up with your doctor or in the ED in 24-48 hours for recheck of your infection if you are not improving.  Call your doctor sooner or return to the ED if you develop worsening signs of infection such as: increased redness, increased pain, pus, fever, or other symptoms that concern you. ° °

## 2017-03-31 NOTE — ED Provider Notes (Signed)
Blood pressure 139/81, pulse 74, temperature 99.1 F (37.3 C), temperature source Oral, resp. rate 18, SpO2 98 %.  Assuming care from Dr. Adela LankFloyd.  In short, Willie Ayala is a 36 y.o. male with a chief complaint of Leg Swelling .  Refer to the original H&P for additional details.  The current plan of care is to f/u leg imaging and reassess. Would consider keflex if no DVT or underlying bony abnormality.  05:15 PM No DVT or bony abnormality on imaging. Plan for keflex and PCP follow up this week.   At this time, I do not feel there is any life-threatening condition present. I have reviewed and discussed all results (EKG, imaging, lab, urine as appropriate), exam findings with patient. I have reviewed nursing notes and appropriate previous records.  I feel the patient is safe to be discharged home without further emergent workup. Discussed usual and customary return precautions. Patient and family (if present) verbalize understanding and are comfortable with this plan.  Patient will follow-up with their primary care provider. If they do not have a primary care provider, information for follow-up has been provided to them. All questions have been answered.  Alona BeneJoshua Long, MD    Maia PlanLong, Joshua G, MD 03/31/17 [phone]

## 2017-04-12 ENCOUNTER — Inpatient Hospital Stay (HOSPITAL_BASED_OUTPATIENT_CLINIC_OR_DEPARTMENT_OTHER)
Admission: EM | Admit: 2017-04-12 | Discharge: 2017-04-14 | DRG: 603 | Disposition: A | Discharge status: Home / Self Care | Payer: Medicare Other | Attending: Internal Medicine | Admitting: Internal Medicine

## 2017-04-12 ENCOUNTER — Encounter (HOSPITAL_BASED_OUTPATIENT_CLINIC_OR_DEPARTMENT_OTHER): Payer: Self-pay

## 2017-04-12 DIAGNOSIS — L03116 Cellulitis of left lower limb: Principal | ICD-10-CM | POA: Diagnosis present

## 2017-04-12 DIAGNOSIS — R739 Hyperglycemia, unspecified: Secondary | ICD-10-CM | POA: Diagnosis present

## 2017-04-12 DIAGNOSIS — Q932 Chromosome replaced with ring, dicentric or isochromosome: Secondary | ICD-10-CM | POA: Diagnosis not present

## 2017-04-12 DIAGNOSIS — Q9389 Other deletions from the autosomes: Secondary | ICD-10-CM

## 2017-04-12 LAB — CBC
HCT: 40.8 % (ref 39.0–52.0)
Hemoglobin: 13.6 g/dL (ref 13.0–17.0)
MCH: 31 pg (ref 26.0–34.0)
MCHC: 33.3 g/dL (ref 30.0–36.0)
MCV: 92.9 fL (ref 78.0–100.0)
Platelets: 295 10*3/uL (ref 150–400)
RBC: 4.39 MIL/uL (ref 4.22–5.81)
RDW: 13.3 % (ref 11.5–15.5)
WBC: 9 10*3/uL (ref 4.0–10.5)

## 2017-04-12 LAB — BASIC METABOLIC PANEL
Anion gap: 10 (ref 5–15)
BUN: 21 mg/dL — AB (ref 6–20)
CO2: 26 mmol/L (ref 22–32)
CREATININE: 0.69 mg/dL (ref 0.61–1.24)
Calcium: 8.8 mg/dL — ABNORMAL LOW (ref 8.9–10.3)
Chloride: 103 mmol/L (ref 101–111)
GFR calc Af Amer: >60 mL/min (ref >60)
GLUCOSE: 180 mg/dL — AB (ref 65–99)
Potassium: 3.9 mmol/L (ref 3.5–5.1)
SODIUM: 139 mmol/L (ref 135–145)

## 2017-04-12 LAB — I-STAT CG4 LACTIC ACID, ED
LACTIC ACID, VENOUS: 2.2 mmol/L — AB (ref 0.5–1.9)
Lactic Acid, Venous: 0.69 mmol/L (ref 0.5–1.9)

## 2017-04-12 MED ORDER — ENOXAPARIN SODIUM 40 MG/0.4ML ~~LOC~~ SOLN
40.0000 mg | Freq: Every day | SUBCUTANEOUS | Status: DC
  Administered 2017-04-13 (×2): 40 mg via SUBCUTANEOUS
  Filled 2017-04-12 (×2): qty 0.4

## 2017-04-12 MED ORDER — SODIUM CHLORIDE 0.9 % IV BOLUS (SEPSIS)
1000.0000 mL | Freq: Once | INTRAVENOUS | Status: AC
  Administered 2017-04-12: 1000 mL via INTRAVENOUS

## 2017-04-12 MED ORDER — SODIUM CHLORIDE 0.45 % IV SOLN
INTRAVENOUS | Status: DC
  Administered 2017-04-12: 1000 mL via INTRAVENOUS
  Administered 2017-04-13: 09:00:00 via INTRAVENOUS

## 2017-04-12 MED ORDER — INSULIN ASPART 100 UNIT/ML ~~LOC~~ SOLN: 0.0000 [IU] | Freq: Three times a day (TID) | SUBCUTANEOUS | Status: DC

## 2017-04-12 MED ORDER — VANCOMYCIN HCL 500 MG IV SOLR
INTRAVENOUS | Status: AC
  Administered 2017-04-12: 1250 mg
  Filled 2017-04-12: qty 1500

## 2017-04-12 MED ORDER — VANCOMYCIN HCL IN DEXTROSE 1-5 GM/200ML-% IV SOLN
1000.0000 mg | Freq: Two times a day (BID) | INTRAVENOUS | Status: DC
Start: 2017-04-13 — End: 2017-04-14
  Administered 2017-04-13 – 2017-04-14 (×3): 1000 mg via INTRAVENOUS
  Filled 2017-04-12 (×3): qty 200

## 2017-04-12 MED ORDER — VANCOMYCIN HCL 10 G IV SOLR
1250.0000 mg | Freq: Once | INTRAVENOUS | Status: AC
  Administered 2017-04-12: 1250 mg via INTRAVENOUS
  Filled 2017-04-12: qty 1250

## 2017-04-12 MED ORDER — ACETAMINOPHEN 160 MG/5ML PO SOLN
650.0000 mg | Freq: Once | ORAL | Status: AC
  Administered 2017-04-12: 650 mg via ORAL
  Filled 2017-04-12: qty 20.3

## 2017-04-12 NOTE — ED Notes (Signed)
ED Provider at bedside. 

## 2017-04-12 NOTE — H&P (Signed)
History and Physical    Volney Reierson ZOX:[id number] DOB: 12/14/1980 DOA: 04/12/2017  PCP: Patient, No Pcp Per   Patient coming from: Home.  I have personally briefly reviewed patient's old medical records in [place]  Chief Complaint: Left LE swelling.  HPI: Geral Coker is a 36 y.o. male with medical history significant of ring chromosome 22 syndrome who was taken to [facility] for evaluation left LE edema and erythema for about 12 days which has not responded to oral antibiotic therapy with cephalexin. His mother states that he has been having fever at home. He is non-verbal and unable to provide further history.   ED Course: VS were temp 100.33F, 96 bpm, 18 bpm, 140/93 mmHg and O2 sat 100%. The patient received a NS bolus in the ED and IV vancomycin. WBC was 9.0, Hb 13.6 gr/dL and platelets 295. Sodium 139, potassium 3.9, chloride 103 and bicarbonate 26 mmol/L. BUN was 21, creatinine 0.69 and glucose 180 mg/dL. Lactic acid initially was 2.20, then 0.69 mmol/L after IV fluids.He had a negative for DVT duplex 12 days ago.  Review of Systems: As per HPI otherwise 10 point review of systems negative.    Past Medical History:  Diagnosis Date  . Nonverbal   . 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 No pertinent surgical history.   reports that he has never smoked. He has never used smokeless tobacco. He reports that he does not drink alcohol or use drugs.  No Known Allergies  Family History  Problem Relation Age of Onset  . Parkinson's disease Father   . Cancer Maternal Grandmother   . Cancer Paternal Grandfather   . Hypertension Paternal Grandfather     Prior to Admission medications   Not on File    Physical Exam: Vitals:   04/12/17 2000 04/12/17 2030 04/12/17 2100 04/12/17 2150  BP: (!) 137/92 136/88 (!) 146/99 130/79  Pulse: 85 86 90 95  Resp: _0 Temp:    99.6 F (37.6 C)  TempSrc:    Oral  SpO2: 98% 97% 99% 98%  Weight:        Constitutional:  NAD, calm, comfortable Eyes: PERRL, lids and conjunctivae normal ENMT: Mucous membranes are moist. Posterior pharynx clear of any exudate or lesions. Neck: Normal, supple, no masses, no thyromegaly Respiratory: Clear to auscultation bilaterally, no wheezing, no crackles. Normal respiratory effort. No accessory muscle use.  Cardiovascular: Regular rate and rhythm, no murmurs / rubs / gallops. + lymphedema. 2+ pedal pulses. No carotid bruits.  Abdomen: Soft, no tenderness, no masses palpated. No hepatosplenomegaly. Bowel sounds positive.  Musculoskeletal: no clubbing / cyanosis. Decreased ROM on lower extremities, no contractures. Normal muscle tone.  Skin: Bilateral lower extremities lymphedema. Left lower extremity pretibial erythema Neurologic: CN 2-12 grossly intact. Sensation intact, DTR normal. Strength 5/5 in all 4.  Psychiatric: Normal judgment and insight. Alert and oriented x 4. Normal mood.    Labs on Admission: I have personally reviewed following labs and imaging studies  CBC:  Recent Labs Lab 04/12/17 1741  WBC 9.0  HGB 13.6  HCT 40.8  MCV 92.9  PLT 811   Basic Metabolic Panel:  Recent Labs Lab 04/12/17 1741  NA 139  K 3.9  CL 103  CO2 26  GLUCOSE 180*  BUN 21*  CREATININE 0.69  CALCIUM 8.8*   GFR: CrCl cannot be calculated (Unknown ideal weight.). Liver Function Tests: No results for input(s): AST, ALT, ALKPHOS, BILITOT, PROT, ALBUMIN  in the last 168 hours. No results for input(s): LIPASE, AMYLASE in the last 168 hours. No results for input(s): AMMONIA in the last 168 hours. Coagulation Profile: No results for input(s): INR, PROTIME in the last 168 hours. Cardiac Enzymes: No results for input(s): CKTOTAL, CKMB, CKMBINDEX, TROPONINI in the last 168 hours. BNP (last 3 results) No results for input(s): PROBNP in the last 8760 hours. HbA1C: No results for input(s): HGBA1C in the last 72 hours. CBG: No results for input(s): GLUCAP in the last 168  hours. Lipid Profile: No results for input(s): CHOL, HDL, LDLCALC, TRIG, CHOLHDL, LDLDIRECT in the last 72 hours. Thyroid Function Tests: No results for input(s): TSH, T4TOTAL, FREET4, T3FREE, THYROIDAB in the last 72 hours. Anemia Panel: No results for input(s): VITAMINB12, FOLATE, FERRITIN, TIBC, IRON, RETICCTPCT in the last 72 hours. Urine analysis:    Component Value Date/Time   COLORURINE AMBER (A) 09/13/2012 1340   APPEARANCEUR CLEAR 09/13/2012 1340   LABSPEC 1.040 (H) 09/13/2012 1340   PHURINE 5.5 09/13/2012 1340   GLUCOSEU 500 (A) 09/13/2012 1340   HGBUR NEGATIVE 09/13/2012 1340   BILIRUBINUR NEGATIVE 09/13/2012 1340   KETONESUR 40 (A) 09/13/2012 1340   PROTEINUR NEGATIVE 09/13/2012 1340   UROBILINOGEN 0.2 09/13/2012 1340   NITRITE NEGATIVE 09/13/2012 1340   LEUKOCYTESUR NEGATIVE 09/13/2012 1340    Radiological Exams on Admission: No results found.  EKG: Independently reviewed.   Assessment/Plan Principal Problem:   Cellulitis of left lower extremity Admit to MedSurg. Continue analgesics as needed. Continue Vancomycin per pharmacy. Follow up BC and sensitivity.  Consider repeating LE duplex if no improvement.  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    Hyperglycemia Carbohydrate modified diet. CBG monitoring with RI sliding scale. Check hemoglobin A1c in AM.    DVT prophylaxis: Lovenox SQ. Code Status: Full code. Family Communication: His mother was present in his room. Disposition Plan: Admit for IV antibiotics for 2-3 days. Consults called:  Admission status: Inpatient/MedSurg   Reubin Milan MD [facility] Pager [phone]  If 7PM-7AM, please contact night-coverage [email or web address] Password [facility]  04/12/2017, 11:06 PM

## 2017-04-12 NOTE — Progress Notes (Signed)
Pharmacy Antibiotic Note Willie BurlyDavid Ayala is a 36 y.o. male admitted on 04/12/2017 with persistent L lower leg swelling. Pt recently finished course of Keflex without improvement. WBC wnl, SCr 0.69, LA 2.2, afebrile.   Plan: -Vancomycin 1250 mg IV x1 then 1g/12h -Monitor renal fx, cultures, obtain VT at Css   Weight: 139 lb (63 kg)  Temp (24hrs), Avg:100.3 F (37.9 C), Min:100.3 F (37.9 C), Max:100.3 F (37.9 C)   Recent Labs Lab 04/12/17 1741 04/12/17 1754  WBC 9.0  --   CREATININE 0.69  --   LATICACIDVEN  --  2.20*      Antimicrobials this admission: 6/22 vancomycin >   Dose adjustments this admission: N/A   Microbiology results: 6/22 blood cx:   Willie FridayMasters, Willie Ayala 04/12/2017 6:44 PM

## 2017-04-12 NOTE — ED Provider Notes (Signed)
[facility] DEPT [facility] Provider Note   CSN: [id number] Arrival date & time: 04/12/17  1711  By signing my name below, I, Collene Leyden, attest that this documentation has been prepared under the direction and in the presence of Shary Decamp, PA-C. Electronically Signed: Collene Leyden, Scribe. 04/12/17. 5:37 PM.  History   Chief Complaint Chief Complaint  Patient presents with  . Leg Swelling    LEVEL 5 CAVEAT DUE TO PATIENT BEING NONVERBAL   HPI Comments: Willie Ayala is a 36 y.o. 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 Seen in ED on 10th and dx cellulitis and given Rx keflex. According to the patients mother initially the patient had erythema, warmth, and swelling to the left leg. Mother states the symptoms have mildly improved with a 10 day course of keflex, but his symptoms still persist. Mother reports an associated fever. Mother denies any additional or new symptoms.   The history is provided by a parent. No language interpreter was used.    Past Medical History:  Diagnosis Date  . Nonverbal   . 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   History reviewed. No pertinent surgical history.     Home Medications    Prior to Admission medications   Not on File    Family History No family history on file.  Social History Social History  Substance Use Topics  . Smoking status: Never Smoker  . Smokeless tobacco: Never Used  . Alcohol use No     Allergies   Patient has no known allergies.   Review of Systems Review of Systems  Unable to perform ROS: Other (Patient is nonverbal)     Physical Exam Updated Vital Signs BP (!) 140/93 (BP Location: Left Arm)   Pulse 96   Temp 100.3 F (37.9 C) (Oral)   Resp 18   Wt 139 lb (63 kg)   SpO2 100%   Physical Exam    Constitutional: He is oriented to person, place, and time. Vital signs are normal. He appears well-developed and well-nourished.  HENT:  Head: Normocephalic and atraumatic.  Right Ear: Hearing normal.  Left Ear: Hearing normal.  Mouth/Throat: Oropharynx is clear and moist.  Eyes: Conjunctivae and EOM are normal. Pupils are equal, round, and reactive to light.  Neck: Normal range of motion. Neck supple.  Cardiovascular: Normal rate and regular rhythm.   Pulmonary/Chest: Effort normal and breath sounds normal.  Abdominal: Soft. Bowel sounds are normal.  Musculoskeletal: Normal range of motion. He exhibits edema. He exhibits no tenderness or deformity.  Defer to picture below. Bilateral lymphadenopathy. Pitting edema 3+. Left lower extremity is warm to touch with erythema present. No purulence. NVI. Distal pulses appreciated.   Neurological: He is alert and oriented to person, place, and time.  Skin: Skin is warm and dry.  Psychiatric: He has a normal mood and affect. His speech is normal and behavior is normal. Thought content normal.  Nursing note and vitals reviewed.       ED Treatments / Results  DIAGNOSTIC STUDIES: Oxygen Saturation is 100% on RA, normal by my interpretation.    COORDINATION OF CARE: 5:34 PM Discussed treatment plan with pt at bedside and pt agreed to plan, which includes IV antibiotics.   Labs (all labs ordered are listed, but only abnormal results are  displayed) Labs Reviewed  BASIC METABOLIC PANEL - Abnormal; Notable for the following:       Result Value   Glucose, Bld 180 (*)    BUN 21 (*)    Calcium 8.8 (*)    All other components within normal limits  I-STAT CG4 LACTIC ACID, ED - Abnormal; Notable for the following:    Lactic Acid, Venous 2.20 (*)    All other components within normal limits  CULTURE, BLOOD (ROUTINE X 2)  CULTURE, BLOOD (ROUTINE X 2)  CBC    EKG  EKG Interpretation None      Radiology No results  found.  Procedures Procedures (including critical care time)  Medications Ordered in ED Medications  acetaminophen (TYLENOL) solution 650 mg (650 mg Oral Given 04/12/17 1753)  sodium chloride 0.9 % bolus 1,000 mL (1,000 mLs Intravenous New Bag/Given 04/12/17 1815)   Initial Impression / Assessment and Plan / ED Course  I have reviewed the triage vital signs and the nursing notes.  Pertinent labs & imaging results that were available during my care of the patient were reviewed by me and considered in my medical decision making (see chart for details).  Final Clinical Impressions(s) / ED Diagnoses   {I have reviewed and evaluated the relevant laboratory values.   {I have reiewed the relevant previous healthcare records.  {I obtained HPI from historian. {Patient discussed with supervising physician.  ED Course:  Assessment: Pt is a 36 y.o. male with hx ring chromosome 22 syndrome who presents with recurrent LLE cellulitis. Dx on the 10th. Given Keflex. DVT US at that time negative. Symptoms persist. Fever present. On exam, pt in Nontoxic/nonseptic appearing. VSS. Temp 100.81F. Lungs CTA. Heart RRR. Abdomen nontender soft. iStat Lactate 2.20. WBC unremarkable. Given vancomycin abx to cover cellulitis. Plan is to Admit to medicine.   Disposition/Plan:  Admit Pt acknowledges and agrees with plan  Supervising Physician Mackuen, Fredia Sorrow, MD  Final diagnoses:  Cellulitis of left lower extremity    New Prescriptions New Prescriptions   No medications on file   I personally performed the services described in this documentation, which was scribed in my presence. The recorded information has been reviewed and is accurate.    Shary Decamp, PA-C 04/12/17 1833    Macarthur Critchley, MD 04/12/17 2245

## 2017-04-12 NOTE — ED Triage Notes (Signed)
Per mother (pt nonverbal) pt with cont'd swelling to left leg after recent treated for cellulitis-hx of bilat LE swelling-NAD-slow steady gait

## 2017-04-12 NOTE — Plan of Care (Signed)
Accepted to [facility] as inpatient for cellulitis of LLE.  VS O2 sat 100%, Temp 100.3 F, RR 18 bpm, pulse 96 bpm, BP 140/93 mmHg.  He was given NS bolus 1 liter, vancomycin and acetaminophen in ED. HAd negative doppler US for DVT 12 days ago.   History              Chief Complaint    Chief Complaint  Patient presents with  . Leg Swelling    LEVEL 5 CAVEAT DUE TO PATIENT BEING NONVERBAL   HPI Comments: Willie Ayala is a 36 y.o. 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 Seen in ED on 10th and dx cellulitis and given Rx keflex. According to the patients mother initially the patient had erythema, warmth, and swelling to the left leg. Mother states the symptoms have mildly improved with a 10 day course of keflex, but his symptoms still persist. Mother reports an associated fever. Mother denies any additional or new symptoms.   The history is provided by a parent. No language interpreter was used.   Basic metabolic panel [[id number]] (Abnormal) Collected: 04/12/17 1741  Updated: 04/12/17 1813   Specimen Type: Blood   Specimen Source: Vein    Sodium 139 mmol/L   Potassium 3.9 mmol/L   Chloride 103 mmol/L   CO2 26 mmol/L   Glucose, Bld 180 (H) mg/dL   BUN 21 (H) mg/dL   Creatinine, Ser 0.69 mg/dL   Calcium 8.8 (L) mg/dL   GFR calc non Af Amer >60 mL/min   GFR calc Af Amer >60 mL/min   Anion gap 10  CBC [[id number]] Collected: 04/12/17 1741  Updated: 04/12/17 1803   Specimen Type: Blood   Specimen Source: Vein    WBC 9.0 K/uL   RBC 4.39 MIL/uL   Hemoglobin 13.6 g/dL   HCT 40.8 %   MCV 92.9 fL   MCH 31.0 pg   MCHC 33.3 g/dL   RDW 13.3 %   Platelets 295 K/uL  I-Stat CG4 Lactic Acid, ED [[id number]] (Abnormal) Collected: 04/12/17 1754  Updated: 04/12/17 1757   Specimen Type: Blood    Lactic Acid, Venous 2.20 (HH) mmol/L   Comment NOTIFIED  PHYSICIAN    Tennis Must, MD

## 2017-04-13 LAB — GLUCOSE, CAPILLARY
GLUCOSE-CAPILLARY: 87 mg/dL (ref 65–99)
GLUCOSE-CAPILLARY: 86 mg/dL (ref 65–99)
GLUCOSE-CAPILLARY: 96 mg/dL (ref 65–99)
Glucose-Capillary: 135 mg/dL — ABNORMAL HIGH (ref 65–99)

## 2017-04-13 LAB — CBC WITH DIFFERENTIAL/PLATELET
BASOS ABS: 0 10*3/uL (ref 0.0–0.1)
BASOS PCT: 1 %
EOS ABS: 0.2 10*3/uL (ref 0.0–0.7)
Eosinophils Relative: 2 %
HEMATOCRIT: 39.3 % (ref 39.0–52.0)
HEMOGLOBIN: 12.7 g/dL — AB (ref 13.0–17.0)
Lymphocytes Relative: 11 %
Lymphs Abs: 0.9 10*3/uL (ref 0.7–4.0)
MCH: 29.3 pg (ref 26.0–34.0)
MCHC: 32.3 g/dL (ref 30.0–36.0)
MCV: 90.8 fL (ref 78.0–100.0)
MONO ABS: 0.6 10*3/uL (ref 0.1–1.0)
Monocytes Relative: 7 %
NEUTROS ABS: 6.5 10*3/uL (ref 1.7–7.7)
NEUTROS PCT: 79 %
Platelets: 270 10*3/uL (ref 150–400)
RBC: 4.33 MIL/uL (ref 4.22–5.81)
RDW: 13.2 % (ref 11.5–15.5)
WBC: 8.2 10*3/uL (ref 4.0–10.5)

## 2017-04-13 LAB — BASIC METABOLIC PANEL
ANION GAP: 5 (ref 5–15)
BUN: 10 mg/dL (ref 6–20)
CALCIUM: 8.7 mg/dL — AB (ref 8.9–10.3)
CO2: 27 mmol/L (ref 22–32)
Chloride: 106 mmol/L (ref 101–111)
Creatinine, Ser: 0.63 mg/dL (ref 0.61–1.24)
GFR calc non Af Amer: >60 mL/min (ref >60)
Glucose, Bld: 89 mg/dL (ref 65–99)
Potassium: 4.1 mmol/L (ref 3.5–5.1)
SODIUM: 138 mmol/L (ref 135–145)

## 2017-04-13 LAB — MRSA PCR SCREENING: MRSA by PCR: NEGATIVE

## 2017-04-13 MED ORDER — ACETAMINOPHEN 160 MG/5ML PO SOLN: 650.0000 mg | Freq: Four times a day (QID) | ORAL | Status: DC | PRN

## 2017-04-13 NOTE — Progress Notes (Signed)
Patient ID: Willie Ayala, male   DOB: 1981/06/11, 36 y.o.   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 He was admitted on IV antibiotics for cellulitis.  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    Hyperglycemia Probably reactive. Improved    DVT prophylaxis: Lovenox Code Status:  Full code Family Communication: Discussed with mother present at bedside Disposition Plan: Home in 1-2 days if patient improves  Consultants: None  Procedures: None  Antimicrobials: Vancomycin from 04/12/2017  Subjective: Patient seen and examined at bedside. He is nonverbal. Patient's mother was present at bedside feels that his left lower extremity redness is improving. No overnight fever, nausea, vomiting  Objective: Vitals:   04/12/17 2030 04/12/17 2100 04/12/17 2150 04/13/17 0610  BP: 136/88 (!) 146/99 130/79 135/65  Pulse: 86 90 95 84  Resp: 17 20 18 16   Temp:   99.6 F (37.6 C) 100 F (37.8 C)  TempSrc:   Oral Oral  SpO2: 97% 99% 98% 98%  Weight:        Intake/Output Summary (Last 24 hours) at 04/13/17 1330 Last data filed at 04/13/17 0915  Gross per 24 hour  Intake             1730 ml  Output                0 ml  Net             1730 ml   Filed Weights   04/12/17 1720  Weight: 63 kg (139 lb)    Examination:  General exam: Appears calm and comfortable  Respiratory system: Bilateral decreased breath sound at  bases Cardiovascular system: S1 & S2 heard,Rate controlled  Gastrointestinal system: Abdomen is nondistended, soft and nontender. Normal bowel sounds heard. Central nervous system: Alert and oriented. No focal neurological deficits. Moving extremities Extremities: Bilateral lower extremities lymphedema, left lower extremity mild erythema from the ankle extending upwards towards the knee   Data Reviewed: I have personally reviewed following labs and imaging studies  CBC:  Recent Labs Lab 04/12/17 1741 04/13/17 0741  WBC 9.0 8.2  NEUTROABS  --  6.5  HGB 13.6 12.7*  HCT 40.8 39.3  MCV 92.9 90.8  PLT 295 060   Basic Metabolic Panel:  Recent Labs Lab 04/12/17 1741 04/13/17 0741  NA 139 138  K 3.9 4.1  CL 103 106  CO2 26 27  GLUCOSE 180* 89  BUN 21* 10  CREATININE 0.69 0.63  CALCIUM 8.8* 8.7*   GFR: CrCl cannot be calculated (Unknown ideal weight.). Liver Function Tests: No results for input(s): AST, ALT, ALKPHOS, BILITOT, PROT, ALBUMIN in the last 168 hours. No results for input(s): LIPASE, AMYLASE in the last 168 hours. No results for input(s): AMMONIA in the last 168 hours. Coagulation Profile: No results for input(s): INR, PROTIME in the last 168 hours. Cardiac Enzymes: No results for input(s): CKTOTAL, CKMB, CKMBINDEX, TROPONINI in the last 168 hours. BNP (last 3 results)  No results for input(s): PROBNP in the last 8760 hours. HbA1C: No results for input(s): HGBA1C in the last 72 hours. CBG:  Recent Labs Lab 04/13/17 0747 04/13/17 1131  GLUCAP 87 86   Lipid Profile: No results for input(s): CHOL, HDL, LDLCALC, TRIG, CHOLHDL, LDLDIRECT in the last 72 hours. Thyroid Function Tests: No results for input(s): TSH, T4TOTAL, FREET4, T3FREE, THYROIDAB in the last 72 hours. Anemia Panel: No results for input(s): VITAMINB12, FOLATE, FERRITIN, TIBC, IRON, RETICCTPCT in the last 72 hours. Sepsis Labs:  Recent Labs Lab 04/12/17 1754 04/12/17 2024  LATICACIDVEN  2.20* 0.69    Recent Results (from the past 240 hour(s))  MRSA PCR Screening     Status: None   Collection Time: 04/13/17  2:07 AM  Result Value Ref Range Status   MRSA by PCR NEGATIVE NEGATIVE Final    Comment:        The GeneXpert MRSA Assay (FDA approved for NASAL specimens only), is one component of a comprehensive MRSA colonization surveillance program. It is not intended to diagnose MRSA infection nor to guide or monitor treatment for MRSA infections.          Radiology Studies: No results found.      Scheduled Meds: . enoxaparin (LOVENOX) injection  40 mg Subcutaneous QHS  . insulin aspart  0-9 Units Subcutaneous TID WC   Continuous Infusions: . vancomycin 1,000 mg (04/13/17 0855)     LOS: 1 day        Aline August, MD [facility] Pager [phone]  If 7PM-7AM, please contact night-coverage [email or web address] Password [email or web address] 04/13/2017, 1:30 PM

## 2017-04-14 LAB — CBC WITH DIFFERENTIAL/PLATELET
BASOS ABS: 0.1 10*3/uL (ref 0.0–0.1)
Basophils Relative: 1 %
EOS PCT: 4 %
Eosinophils Absolute: 0.3 10*3/uL (ref 0.0–0.7)
HCT: 38.2 % — ABNORMAL LOW (ref 39.0–52.0)
Hemoglobin: 12.4 g/dL — ABNORMAL LOW (ref 13.0–17.0)
LYMPHS PCT: 17 %
Lymphs Abs: 1 10*3/uL (ref 0.7–4.0)
MCH: 30.2 pg (ref 26.0–34.0)
MCHC: 32.5 g/dL (ref 30.0–36.0)
MCV: 92.9 fL (ref 78.0–100.0)
MONO ABS: 0.6 10*3/uL (ref 0.1–1.0)
MONOS PCT: 10 %
Neutro Abs: 4.1 10*3/uL (ref 1.7–7.7)
Neutrophils Relative %: 68 %
PLATELETS: 227 10*3/uL (ref 150–400)
RBC: 4.11 MIL/uL — ABNORMAL LOW (ref 4.22–5.81)
RDW: 13.3 % (ref 11.5–15.5)
WBC: 6.1 10*3/uL (ref 4.0–10.5)

## 2017-04-14 LAB — HIV ANTIBODY (ROUTINE TESTING W REFLEX): HIV Screen 4th Generation wRfx: NONREACTIVE

## 2017-04-14 LAB — BASIC METABOLIC PANEL
Anion gap: 4 — ABNORMAL LOW (ref 5–15)
BUN: 13 mg/dL (ref 6–20)
CALCIUM: 8.9 mg/dL (ref 8.9–10.3)
CO2: 30 mmol/L (ref 22–32)
CREATININE: 0.65 mg/dL (ref 0.61–1.24)
Chloride: 107 mmol/L (ref 101–111)
GFR calc Af Amer: >60 mL/min (ref >60)
GLUCOSE: 86 mg/dL (ref 65–99)
Potassium: 4 mmol/L (ref 3.5–5.1)
Sodium: 141 mmol/L (ref 135–145)

## 2017-04-14 LAB — HEMOGLOBIN A1C
HEMOGLOBIN A1C: 5.4 % (ref 4.8–5.6)
Mean Plasma Glucose: 108 mg/dL

## 2017-04-14 LAB — GLUCOSE, CAPILLARY: Glucose-Capillary: 78 mg/dL (ref 65–99)

## 2017-04-14 MED ORDER — DOXYCYCLINE HYCLATE 100 MG PO TABS: 100.0000 mg | ORAL_TABLET | Freq: Two times a day (BID) | ORAL | 0 refills | Status: AC

## 2017-04-14 MED ORDER — DOXYCYCLINE HYCLATE 100 MG PO TABS: 100.0000 mg | ORAL_TABLET | Freq: Two times a day (BID) | ORAL | Status: DC

## 2017-04-14 NOTE — Discharge Instructions (Signed)

## 2017-04-14 NOTE — Discharge Summary (Signed)
Physician Discharge Summary  Willie Ayala ZES:[id number] DOB: 1981-04-20 DOA: 04/12/2017  PCP: Patient, No Pcp Per  Admit date: 04/12/2017 Discharge date: 04/14/2017  Admitted From: Home Disposition:  Home  Recommendations for Outpatient Follow-up:  1. 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 He was admitted on IV antibiotics for cellulitis. Cellulitis has improved.  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 Outpatient follow-up  Hyperglycemia Probably reactive. Improved   Discharge Instructions  Discharge Instructions    Call MD for:  difficulty breathing, headache or visual disturbances    Complete by:  As directed    Call MD for:  hives    Complete by:  As directed    Call MD for:  persistant dizziness or light-headedness    Complete by:  As directed    Call MD for:  persistant nausea and vomiting    Complete by:  As directed    Call MD for:  severe uncontrolled pain    Complete by:  As directed    Call MD for:  temperature >100.4    Complete by:  As directed    Diet general    Complete by:  As directed    Increase activity slowly    Complete by:  As directed      Allergies as of 04/14/2017   No Known Allergies     Medication List    TAKE these medications   doxycycline 100 MG tablet Commonly known as:   VIBRA-TABS Take 1 tablet (100 mg total) by mouth every 12 (twelve) hours.        No Known Allergies  Consultations:  None   Procedures/Studies: Dg Chest 2 View  Result Date: 03/16/2017 CLINICAL DATA:  Chronic belching for 2 weeks. EXAM: CHEST  2 VIEW COMPARISON:  09/13/2012 FINDINGS: The heart size and mediastinal contours are within normal limits. Both lungs are clear. The visualized skeletal structures are unremarkable. IMPRESSION: No active cardiopulmonary disease. Electronically Signed   By: Kathreen Devoid   On: 03/16/2017 17:11   Dg Tibia/fibula Left  Result Date: 03/31/2017 CLINICAL DATA:  Left leg swelling and erythema for 1 week. EXAM: LEFT TIBIA AND FIBULA - 2 VIEW COMPARISON:  None. FINDINGS: Diffuse soft tissue swelling. Gracile bones with no fracture or dislocation. No bone destruction or periosteal reaction. No soft tissue gas. IMPRESSION: Diffuse soft tissue swelling without acute underlying bony abnormality. Electronically Signed   By: Claudie Revering M.D.   On: 03/31/2017 16:24   US Venous Img Lower Unilateral Left  Result Date: 03/31/2017 CLINICAL DATA:  Left lower extremity swelling, redness and warmth for 1 week. EXAM: LEFT LOWER EXTREMITY VENOUS DOPPLER ULTRASOUND TECHNIQUE: Gray-scale sonography with graded compression, as well as color Doppler and duplex ultrasound were performed to evaluate the lower extremity deep venous systems from the level of the common femoral vein and including the common femoral, femoral, profunda femoral, popliteal and calf  veins including the posterior tibial, peroneal and gastrocnemius veins when visible. The superficial great saphenous vein was also interrogated. Spectral Doppler was utilized to evaluate flow at rest and with distal augmentation maneuvers in the common femoral, femoral and popliteal veins. COMPARISON:  None. FINDINGS: Contralateral Common Femoral Vein: Respiratory phasicity is normal and symmetric with the symptomatic side. No  evidence of thrombus. Normal compressibility. Common Femoral Vein: No evidence of thrombus. Normal compressibility, respiratory phasicity and response to augmentation. Saphenofemoral Junction: No evidence of thrombus. Normal compressibility and flow on color Doppler imaging. Profunda Femoral Vein: No evidence of thrombus. Normal compressibility and flow on color Doppler imaging. Femoral Vein: No evidence of thrombus. Normal compressibility, respiratory phasicity and response to augmentation. Popliteal Vein: No evidence of thrombus. Normal compressibility, respiratory phasicity and response to augmentation. Calf Veins: No evidence of thrombus. Normal compressibility and flow on color Doppler imaging. Superficial Great Saphenous Vein: No evidence of thrombus. Normal compressibility and flow on color Doppler imaging. Venous Reflux:  None. Other Findings: Subcutaneous edema is seen about the left lower leg and ankle. No focal fluid collection. IMPRESSION: No evidence of DVT within the left lower extremity. Subcutaneous edema about the left lower leg could be due to dependent change but is worrisome for cellulitis based on given history. No focal fluid collection. Electronically Signed   By: Inge Rise M.D.   On: 03/31/2017 16:48       Subjective: Patient seen and examined at bedside. He is nonverbal. Patient's mother was present at bedside and feels that his lower extremity redness is improving. No overnight fever, nausea, vomiting.  Discharge Exam: Vitals:   04/13/17 2049 04/14/17 0523  BP: 110/76 101/62  Pulse: 64 71  Resp: 15 16  Temp: 97.4 F (36.3 C) 98.7 F (37.1 C)   Vitals:   04/13/17 0610 04/13/17 1500 04/13/17 2049 04/14/17 0523  BP: 135/65 134/84 110/76 101/62  Pulse: 84 85 64 71  Resp: 16 16 15 16   Temp: 100 F (37.8 C) 98.3 F (36.8 C) 97.4 F (36.3 C) 98.7 F (37.1 C)  TempSrc: Oral Oral Oral Oral  SpO2: 98% 100% 95% 96%  Weight:        General: Pt is alert, awake,  not in acute distress Cardiovascular: RRR, S1/S2 +, no rubs, no gallops Respiratory: CTA bilaterally, no wheezing, no rhonchi Abdominal: Soft, NT, ND, bowel sounds + Extremities: Bilateral lower extremities lymphedema; left lower extremity very minimal erythema, improving  The results of significant diagnostics from this hospitalization (including imaging, microbiology, ancillary and laboratory) are listed below for reference.     Microbiology: Recent Results (from the past 240 hour(s))  MRSA PCR Screening     Status: None   Collection Time: 04/13/17  2:07 AM  Result Value Ref Range Status   MRSA by PCR NEGATIVE NEGATIVE Final    Comment:        The GeneXpert MRSA Assay (FDA approved for NASAL specimens only), is one component of a comprehensive MRSA colonization surveillance program. It is not intended to diagnose MRSA infection nor to guide or monitor treatment for MRSA infections.      Labs: BNP (last 3 results) No results for input(s): BNP in the last 8760 hours. Basic Metabolic Panel:  Recent Labs Lab 04/12/17 1741 04/13/17 0741 04/14/17 0748  NA 139 138 141  K 3.9 4.1 4.0  CL 103 106 107  CO2 26 27 30   GLUCOSE 180* 89 86  BUN 21* 10 13  CREATININE 0.69 0.63  0.65  CALCIUM 8.8* 8.7* 8.9   Liver Function Tests: No results for input(s): AST, ALT, ALKPHOS, BILITOT, PROT, ALBUMIN in the last 168 hours. No results for input(s): LIPASE, AMYLASE in the last 168 hours. No results for input(s): AMMONIA in the last 168 hours. CBC:  Recent Labs Lab 04/12/17 1741 04/13/17 0741 04/14/17 0748  WBC 9.0 8.2 6.1  NEUTROABS  --  6.5 4.1  HGB 13.6 12.7* 12.4*  HCT 40.8 39.3 38.2*  MCV 92.9 90.8 92.9  PLT 295 270 227   Cardiac Enzymes: No results for input(s): CKTOTAL, CKMB, CKMBINDEX, TROPONINI in the last 168 hours. BNP: Invalid input(s): POCBNP CBG:  Recent Labs Lab 04/13/17 0747 04/13/17 1131 04/13/17 1739 04/13/17 2044 04/14/17 0801  GLUCAP 87 86 96  135* 78   D-Dimer No results for input(s): DDIMER in the last 72 hours. Hgb A1c No results for input(s): HGBA1C in the last 72 hours. Lipid Profile No results for input(s): CHOL, HDL, LDLCALC, TRIG, CHOLHDL, LDLDIRECT in the last 72 hours. Thyroid function studies No results for input(s): TSH, T4TOTAL, T3FREE, THYROIDAB in the last 72 hours.  Invalid input(s): FREET3 Anemia work up No results for input(s): VITAMINB12, FOLATE, FERRITIN, TIBC, IRON, RETICCTPCT in the last 72 hours. Urinalysis    Component Value Date/Time   COLORURINE AMBER (A) 09/13/2012 1340   APPEARANCEUR CLEAR 09/13/2012 1340   LABSPEC 1.040 (H) 09/13/2012 1340   PHURINE 5.5 09/13/2012 1340   GLUCOSEU 500 (A) 09/13/2012 1340   HGBUR NEGATIVE 09/13/2012 1340   BILIRUBINUR NEGATIVE 09/13/2012 1340   KETONESUR 40 (A) 09/13/2012 1340   PROTEINUR NEGATIVE 09/13/2012 1340   UROBILINOGEN 0.2 09/13/2012 1340   NITRITE NEGATIVE 09/13/2012 1340   LEUKOCYTESUR NEGATIVE 09/13/2012 1340   Sepsis Labs Invalid input(s): PROCALCITONIN,  WBC,  LACTICIDVEN Microbiology Recent Results (from the past 240 hour(s))  MRSA PCR Screening     Status: None   Collection Time: 04/13/17  2:07 AM  Result Value Ref Range Status   MRSA by PCR NEGATIVE NEGATIVE Final    Comment:        The GeneXpert MRSA Assay (FDA approved for NASAL specimens only), is one component of a comprehensive MRSA colonization surveillance program. It is not intended to diagnose MRSA infection nor to guide or monitor treatment for MRSA infections.      Time coordinating discharge: 35 minutes  SIGNED:   Aline August, MD  [facility] 04/14/2017, 10:01 AM Pager: [phone]  If 7PM-7AM, please contact night-coverage [email or web address] Password [email or web address]

## 2017-04-14 NOTE — Progress Notes (Signed)
Patient discharged home.  Leaving with personal belongings and one prescription.  Mother reports understanding of discharge instructions.  Room air, no s/s of pain, no s/s of distress.  Accompanied by mother.  No complaints.

## 2017-04-14 NOTE — Care Management Note (Signed)
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 States he has an appt in August at [facility] in [place]. Gave permission for NCM to fax dc summary to office and to request earlier appt. States she will call on tomorrow to get and earlier appt.    PCP [facility]   Expected Discharge Date:  04/14/17               Expected Discharge Plan:  Home/Self Care  In-House Referral:  NA  Discharge planning Services  CM Consult  Post Acute Care Choice:  NA Choice offered to:  NA  DME Arranged:  N/A DME Agency:  NA  HH Arranged:  NA HH Agency:  NA  Status of Service:  Completed, signed off  If discussed at [place] of Stay Meetings, dates discussed:    Additional Comments:  Erenest Rasher, RN 04/14/2017, 11:01 AM

## 2017-04-18 LAB — CULTURE, BLOOD (ROUTINE X 2)
CULTURE: NO GROWTH
Culture: NO GROWTH
SPECIAL REQUESTS: ADEQUATE
SPECIAL REQUESTS: ADEQUATE

## 2018-11-01 IMAGING — CR DG CHEST 2V
2 series · 2 of 2 positions shown · non-contrast
Comparison: 09/13/2012

CLINICAL DATA: Chronic belching for 2 weeks.

EXAM:
CHEST  2 VIEW

[w chest pa]
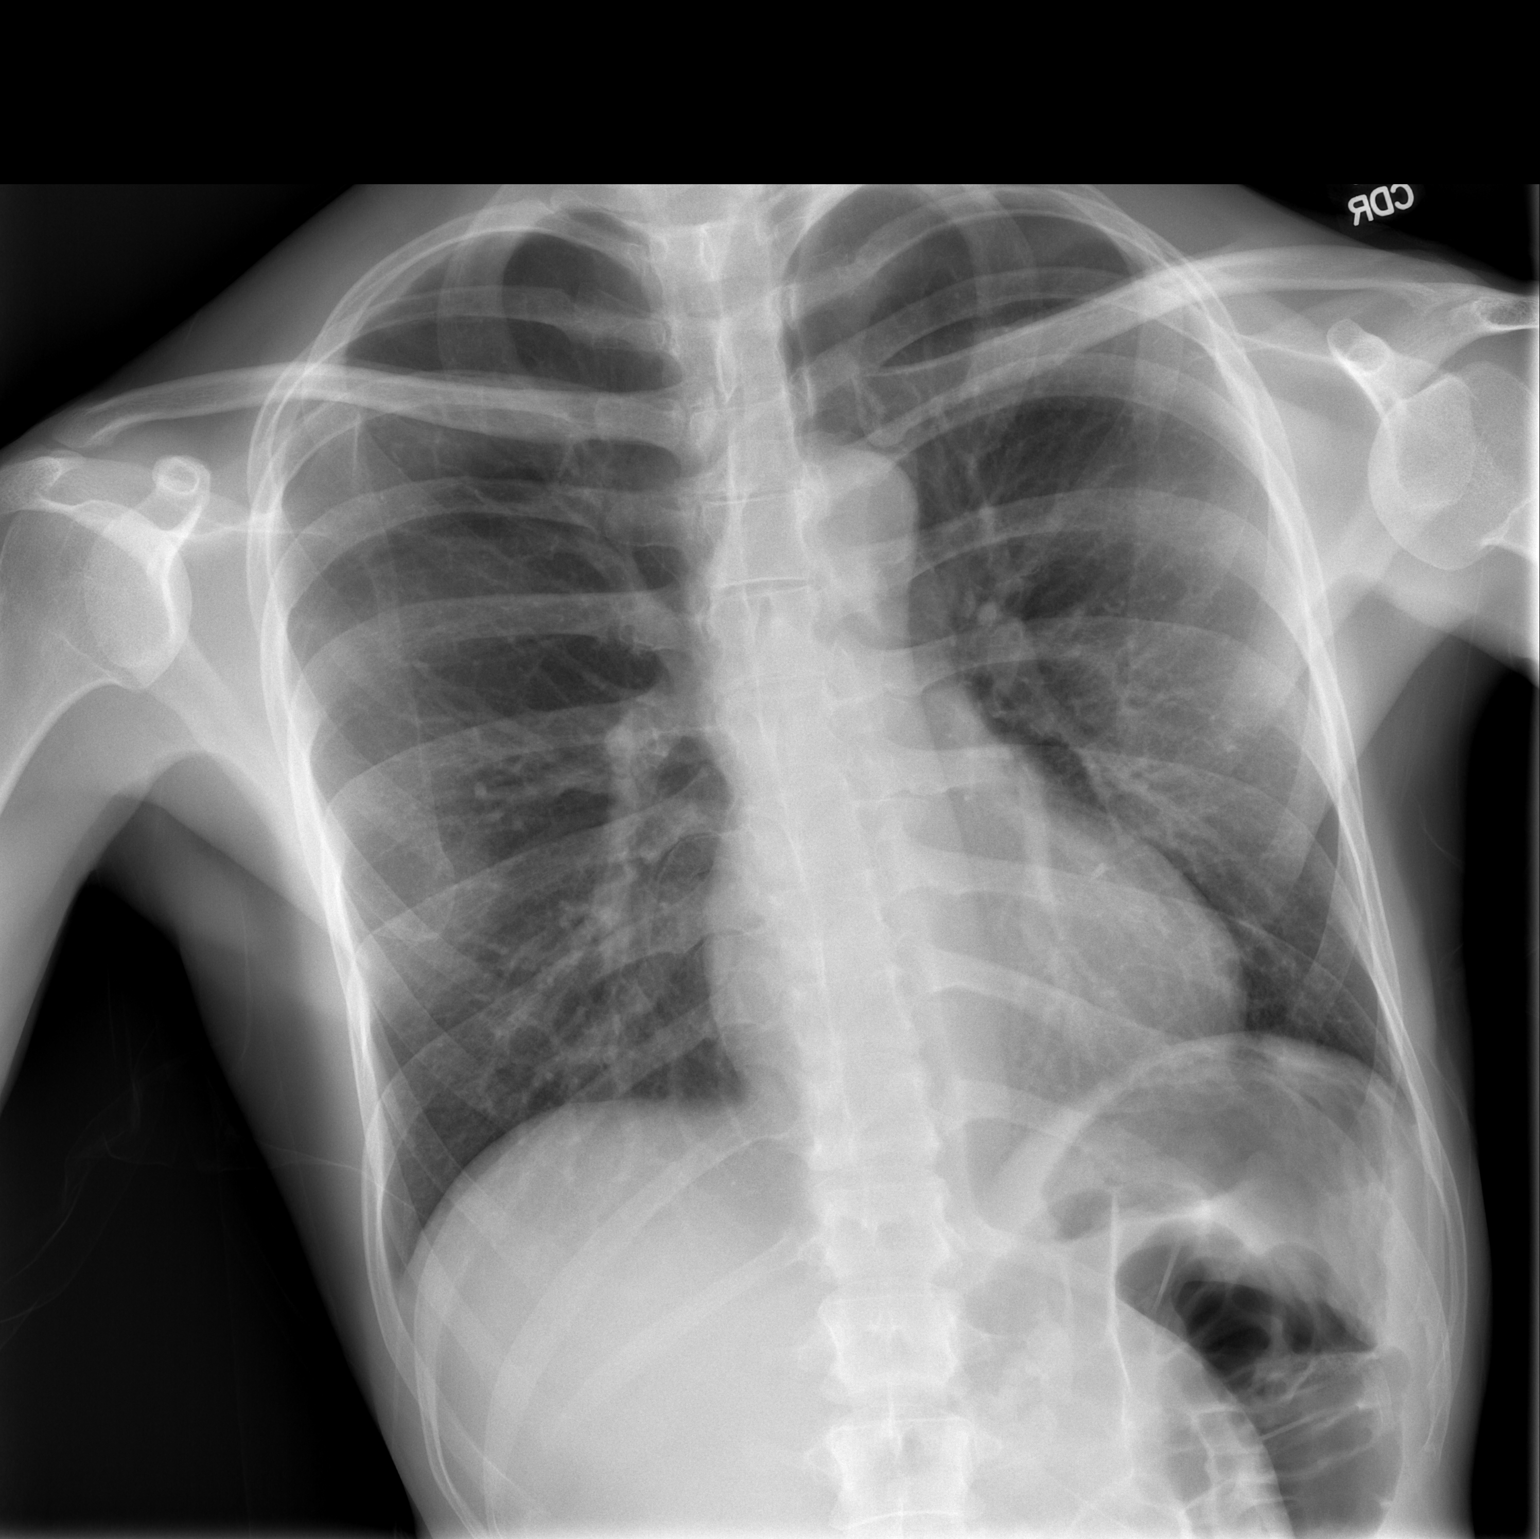

[w chest lat]
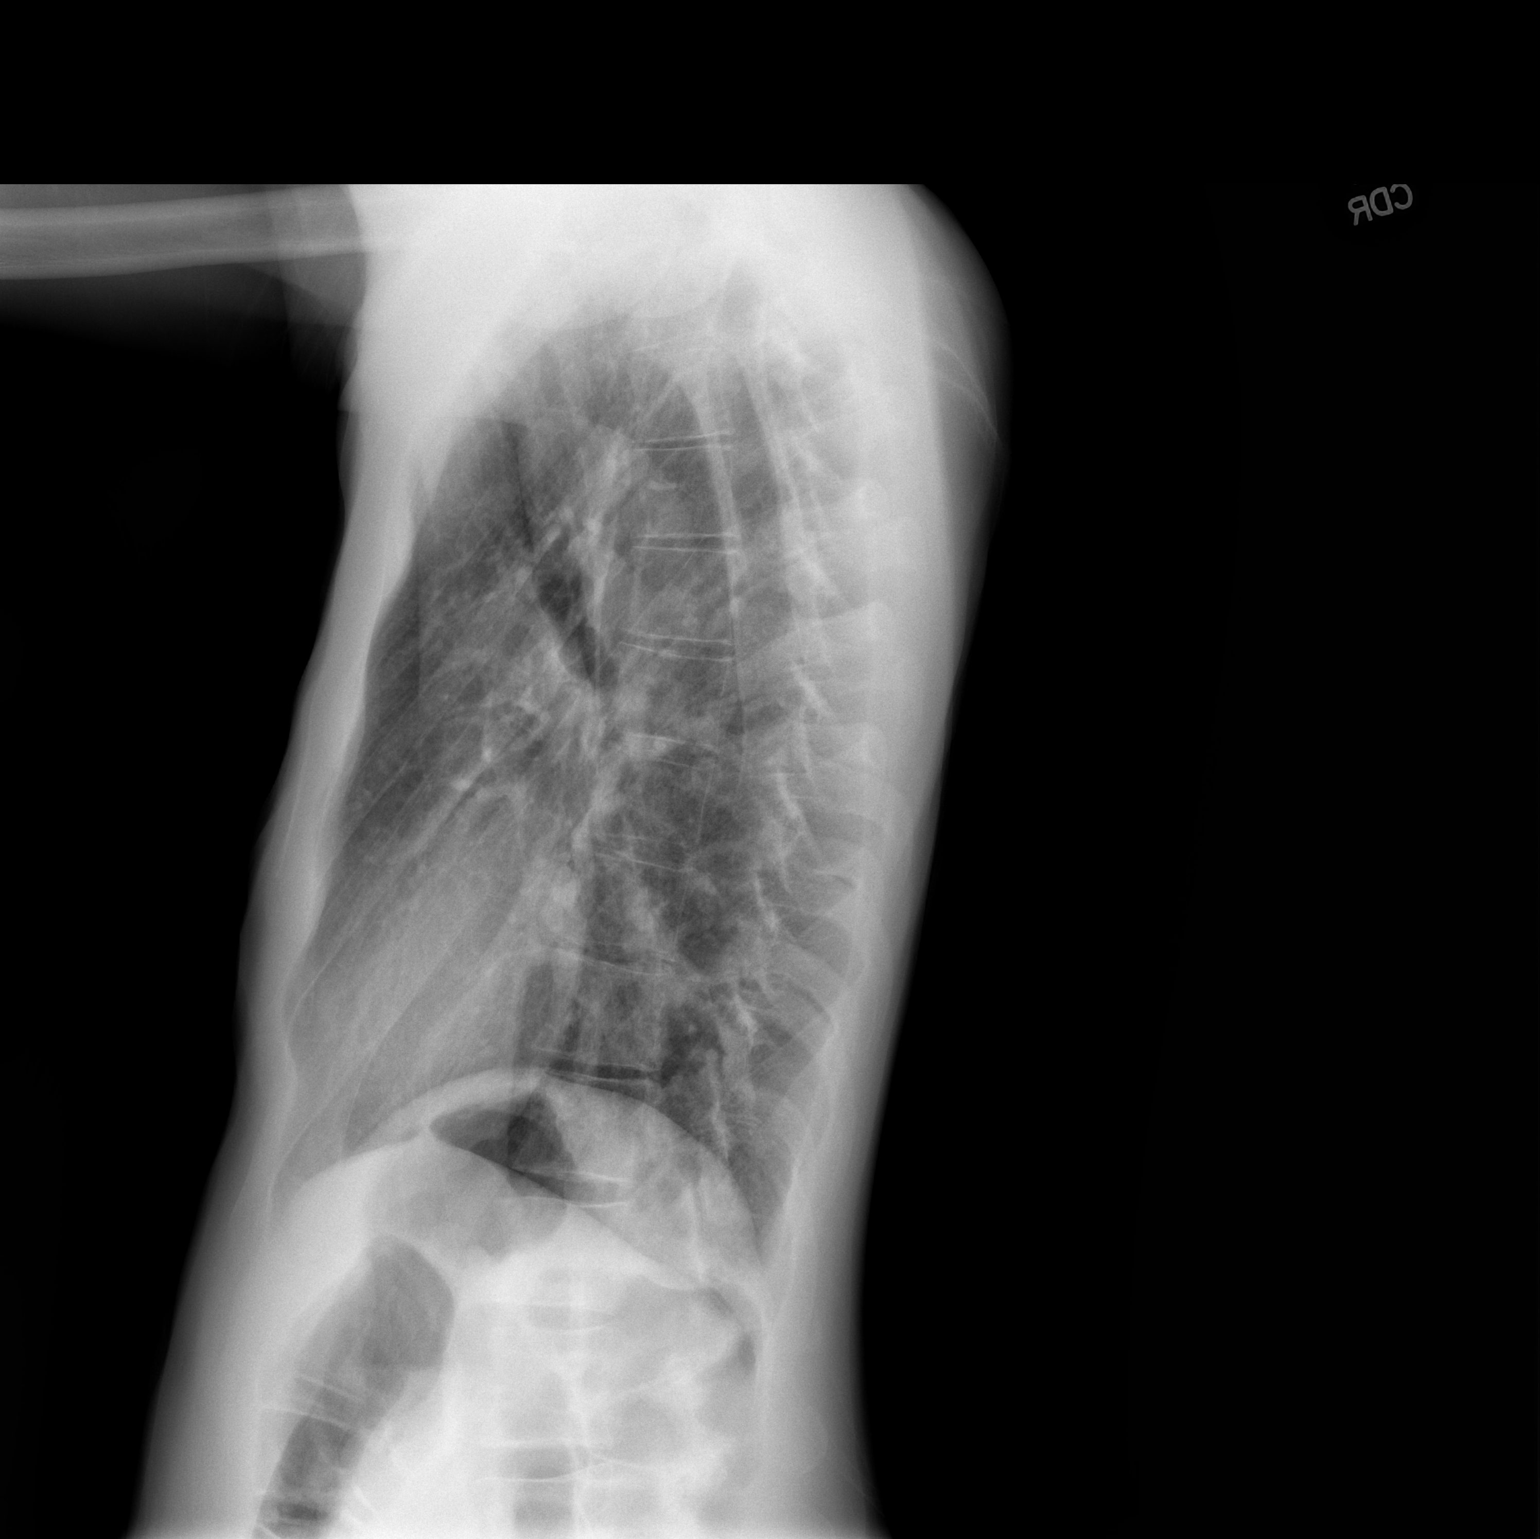

[2 of 2 positions shown; findings below may reference images not displayed]

FINDINGS: The heart size and mediastinal contours are within normal limits.
Both lungs are clear. The visualized skeletal structures are
unremarkable.
IMPRESSION: No active cardiopulmonary disease.

## 2020-01-04 ENCOUNTER — Other Ambulatory Visit: Payer: Self-pay

## 2020-01-04 ENCOUNTER — Emergency Department (HOSPITAL_BASED_OUTPATIENT_CLINIC_OR_DEPARTMENT_OTHER): Payer: Medicare Other

## 2020-01-04 ENCOUNTER — Encounter (HOSPITAL_BASED_OUTPATIENT_CLINIC_OR_DEPARTMENT_OTHER): Payer: Self-pay | Admitting: Emergency Medicine

## 2020-01-04 ENCOUNTER — Emergency Department (HOSPITAL_BASED_OUTPATIENT_CLINIC_OR_DEPARTMENT_OTHER)
Admission: EM | Admit: 2020-01-04 | Discharge: 2020-01-04 | Disposition: A | Discharge status: Home / Self Care | Payer: Medicare Other | Source: Home / Self Care | Attending: Emergency Medicine | Admitting: Emergency Medicine

## 2020-01-04 ENCOUNTER — Encounter (HOSPITAL_BASED_OUTPATIENT_CLINIC_OR_DEPARTMENT_OTHER): Payer: Self-pay | Admitting: *Deleted

## 2020-01-04 ENCOUNTER — Emergency Department (HOSPITAL_BASED_OUTPATIENT_CLINIC_OR_DEPARTMENT_OTHER)
Admission: EM | Admit: 2020-01-04 | Discharge: 2020-01-04 | Disposition: A | Discharge status: Home / Self Care | Payer: Medicare Other | Attending: Emergency Medicine | Admitting: Emergency Medicine

## 2020-01-04 DIAGNOSIS — F29 Unspecified psychosis not due to a substance or known physiological condition: Secondary | ICD-10-CM | POA: Insufficient documentation

## 2020-01-04 DIAGNOSIS — Q932 Chromosome replaced with ring, dicentric or isochromosome: Secondary | ICD-10-CM | POA: Insufficient documentation

## 2020-01-04 DIAGNOSIS — Y939 Activity, unspecified: Secondary | ICD-10-CM | POA: Insufficient documentation

## 2020-01-04 DIAGNOSIS — S60221A Contusion of right hand, initial encounter: Secondary | ICD-10-CM

## 2020-01-04 DIAGNOSIS — Y92009 Unspecified place in unspecified non-institutional (private) residence as the place of occurrence of the external cause: Secondary | ICD-10-CM | POA: Diagnosis not present

## 2020-01-04 DIAGNOSIS — R4182 Altered mental status, unspecified: Secondary | ICD-10-CM | POA: Diagnosis present

## 2020-01-04 DIAGNOSIS — R451 Restlessness and agitation: Secondary | ICD-10-CM

## 2020-01-04 DIAGNOSIS — X58XXXA Exposure to other specified factors, initial encounter: Secondary | ICD-10-CM | POA: Insufficient documentation

## 2020-01-04 DIAGNOSIS — F69 Unspecified disorder of adult personality and behavior: Secondary | ICD-10-CM

## 2020-01-04 DIAGNOSIS — Y999 Unspecified external cause status: Secondary | ICD-10-CM | POA: Diagnosis not present

## 2020-01-04 DIAGNOSIS — R456 Violent behavior: Secondary | ICD-10-CM | POA: Insufficient documentation

## 2020-01-04 LAB — CBC WITH DIFFERENTIAL/PLATELET
Abs Immature Granulocytes: 0.06 10*3/uL (ref 0.00–0.07)
Basophils Absolute: 0 10*3/uL (ref 0.0–0.1)
Basophils Relative: 0 %
Eosinophils Absolute: 0 10*3/uL (ref 0.0–0.5)
Eosinophils Relative: 0 %
HCT: 47 % (ref 39.0–52.0)
Hemoglobin: 15.3 g/dL (ref 13.0–17.0)
Immature Granulocytes: 0 %
Lymphocytes Relative: 3 %
Lymphs Abs: 0.4 10*3/uL — ABNORMAL LOW (ref 0.7–4.0)
MCH: 30.7 pg (ref 26.0–34.0)
MCHC: 32.6 g/dL (ref 30.0–36.0)
MCV: 94.4 fL (ref 80.0–100.0)
Monocytes Absolute: 0.5 10*3/uL (ref 0.1–1.0)
Monocytes Relative: 3 %
Neutro Abs: 13.4 10*3/uL — ABNORMAL HIGH (ref 1.7–7.7)
Neutrophils Relative %: 94 %
Platelets: 216 10*3/uL (ref 150–400)
RBC: 4.98 MIL/uL (ref 4.22–5.81)
RDW: 13.5 % (ref 11.5–15.5)
WBC: 14.4 10*3/uL — ABNORMAL HIGH (ref 4.0–10.5)
nRBC: 0 % (ref 0.0–0.2)

## 2020-01-04 LAB — BASIC METABOLIC PANEL
Anion gap: 16 — ABNORMAL HIGH (ref 5–15)
BUN: 20 mg/dL (ref 6–20)
CO2: 22 mmol/L (ref 22–32)
Calcium: 9.4 mg/dL (ref 8.9–10.3)
Chloride: 103 mmol/L (ref 98–111)
Creatinine, Ser: 0.83 mg/dL (ref 0.61–1.24)
GFR calc Af Amer: >60 mL/min (ref >60)
GFR calc non Af Amer: >60 mL/min (ref >60)
Glucose, Bld: 76 mg/dL (ref 70–99)
Potassium: 3.8 mmol/L (ref 3.5–5.1)
Sodium: 141 mmol/L (ref 135–145)

## 2020-01-04 MED ORDER — OLANZAPINE 10 MG PO TBDP
10.0000 mg | ORAL_TABLET | Freq: Once | ORAL | Status: DC
  Filled 2020-01-04: qty 1

## 2020-01-04 MED ORDER — OLANZAPINE 10 MG PO TABS
10.0000 mg | ORAL_TABLET | Freq: Once | ORAL | Status: DC
  Filled 2020-01-04: qty 1

## 2020-01-04 NOTE — ED Triage Notes (Signed)
Per mother, pt has become aggressive, has locked himself in the bathroom, is slamming doors and is not eating per his norm. She states this is not normal for him. Pt is nonverbal due to rare medical condition.

## 2020-01-04 NOTE — Discharge Instructions (Addendum)
Continue home medications. Return to the ED for any worsening symptoms including increased vomiting, injuries or falls.

## 2020-01-04 NOTE — Discharge Instructions (Addendum)
Recommend following up with his primary care office, psychiatry and neurology.  If he has any seizure-like activity, any episodes of unresponsiveness, self-harm, hurting others, return to ER for reassessment.

## 2020-01-04 NOTE — ED Triage Notes (Signed)
pts mother reports pt was aggressive when she got him home from the ED 1 hour PTA. Pt calm and cooperative in triage. Non-verbal per his norm.

## 2020-01-04 NOTE — ED Provider Notes (Signed)
[facility] EMERGENCY DEPARTMENT Provider Note   CSN: [id number] Arrival date & time: 01/04/20  1526     History Chief Complaint  Patient presents with  . Altered Mental Status    Willie Ayala is a 39 y.o. male with a past medical history of Ringed chromosome 22 syndrome, nonverbal at baseline who presents to ED for altered mental status.  Mother at bedside provides history.  States that patient will have these "episodes" where he becomes aggressive, will punch the wall continuously, locked himself in the bathroom and sit on the toilet for hours.  Also reports making "gassy or gurgly noises" several times when this occurs.  States that this happened yesterday and went on into today.  She noticed that today was "worse than usual" and did not know what else to do other than bring him to the ED.  He has seen a neurologist and psychiatrist about this.  He is on Zoloft and was previously on Seroquel but his provider discontinued this.  He did have one episode of nonbloody, nonbilious emesis but denies any complaints of diarrhea.  Reports normal appetite.  HPI     Past Medical History:  Diagnosis Date  . Nonverbal   . 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 Cellulitis of left lower extremity 04/12/2017  . 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 Hyperglycemia 04/12/2017    History reviewed. No pertinent surgical history.     Family History  Problem Relation Age of Onset  . Parkinson's disease Father   . Cancer Maternal Grandmother   . Cancer Paternal Grandfather   . Hypertension Paternal Grandfather     Social History   Tobacco Use  . Smoking status: Never Smoker  . Smokeless tobacco: Never Used  Substance Use Topics  . Alcohol use: No  . Drug use: No    Home Medications Prior to Admission medications   Not on File    Allergies    Patient has no known allergies.  Review of Systems   Review of Systems   Unable to perform ROS: Patient nonverbal    Physical Exam Updated Vital Signs BP (!) 123/95 (BP Location: Right Arm)   Pulse 95   Temp 98.8 F (37.1 C) (Oral)   Resp 18   Wt 59 kg   SpO2 99%   Physical Exam Vitals and nursing note reviewed.  Constitutional:      General: He is not in acute distress.    Appearance: He is well-developed.  HENT:     Head: Normocephalic and atraumatic.     Nose: Nose normal.  Eyes:     General: No scleral icterus.       Right eye: No discharge.        Left eye: No discharge.     Conjunctiva/sclera: Conjunctivae normal.     Pupils: Pupils are equal, round, and reactive to light.  Cardiovascular:     Rate and Rhythm: Normal rate and regular rhythm.     Heart sounds: Normal heart sounds. No murmur. No friction rub. No gallop.   Pulmonary:     Effort: Pulmonary effort is normal. No respiratory distress.     Breath sounds: Normal breath sounds.  Abdominal:     General: Bowel sounds are normal. There is no distension.     Palpations: Abdomen is soft.     Tenderness: There is no abdominal tenderness. There is no guarding.  Musculoskeletal:        General: Swelling present. Normal range of motion.     Cervical back: Normal range of motion and neck supple.     Comments: Some edema and erythema noted around the PIP joint of the right fourth digit without changes to range of motion.  2+ radial pulse palpated.  Skin:    General: Skin is warm and dry.     Findings: No rash.  Neurological:     Mental Status: He is alert.     Motor: No abnormal muscle tone.     Coordination: Coordination normal.     ED Results / Procedures / Treatments   Labs (all labs ordered are listed, but only abnormal results are displayed) Labs Reviewed  BASIC METABOLIC PANEL - Abnormal; Notable for the following components:      Result Value   Anion gap 16 (*)    All other components within normal limits  CBC WITH DIFFERENTIAL/PLATELET - Abnormal; Notable for the  following components:   WBC 14.4 (*)    Neutro Abs 13.4 (*)    Lymphs Abs 0.4 (*)    All other components within normal limits    EKG None  Radiology DG Hand Complete Right  Result Date: 01/04/2020 CLINICAL DATA:  Redness to entire right ring finger x2 days EXAM: RIGHT HAND - COMPLETE 3+ VIEW COMPARISON:  December 03, 2015 FINDINGS: There is no evidence of fracture or dislocation. There is no evidence of arthropathy or other focal bone abnormality. Soft tissues are unremarkable. IMPRESSION: Negative. Electronically Signed   By: Virgina Norfolk M.D.   On: 01/04/2020 16:45    Procedures Procedures (including critical care time)  Medications Ordered in ED Medications - No data to display  ED Course  I have reviewed the triage vital signs and the nursing notes.  Pertinent labs & imaging results that were available during my care of the patient were reviewed by me and considered in my medical decision making (see chart for details).    MDM Rules/Calculators/A&P                      39yo M with a past medical history of ring chromosome 22 disorder, nonverbal at baseline, presents to ED for concerns of altered mental status since today. Mother states he has had similar episodes in the past that are being evaluated by psychiatry and neurology. Mother was concerned because today's episode seemed worse than usual. He has been on Zoloft for the past month.  Mother states that he will get aggressive, refused to eat or drink and will lock himself in the bathroom.  On exam patient without any abdominal tenderness.  He remains calm and cooperative.  Moving extremities without difficulty.  Mother states that his leg edema appears improved.  He does have some swelling of his right fourth digit which mother states is from hitting the wall.  Will obtain x-ray, baseline labs and reassess.  X-rays negative.  Lab work is unremarkable, nonspecific leukocytosis could be due to his symptoms today.  No  infectious symptoms are suspected.  Suspect that this is an exacerbation of his prior behavioral disturbance without any changes today. Unsure why this is happening, although I feel comfortable as this is followed by his specialists. We will have him continue home medications, follow-up with PCP, neurology and psychiatry and return for worsening symptoms.  Patient is hemodynamically stable, in NAD, and able to ambulate in the ED. Evaluation does not  show pathology that would require ongoing emergent intervention or inpatient treatment. I have personally reviewed and interpreted all lab work and imaging at today's ED visit. I explained the diagnosis to the patient. Pain has been managed and has no complaints prior to discharge. Patient is comfortable with above plan and is stable for discharge at this time. All questions were answered prior to disposition. Strict return precautions for returning to the ED were discussed. Encouraged follow up with PCP.   An After Visit Summary was printed and given to the patient.   Portions of this note were generated with [employer]. Dictation errors may occur despite best attempts at proofreading.   Final Clinical Impression(s) / ED Diagnoses Final diagnoses:  Behavior problem, adult  Contusion of right hand, initial encounter    Rx / DC Orders ED Discharge Orders    None       Delia Heady, PA-C 01/04/20 1657    Lucrezia Starch, MD 01/05/20 2355

## 2020-01-05 NOTE — ED Provider Notes (Signed)
[facility] EMERGENCY DEPARTMENT Provider Note   CSN: [id number] Arrival date & time: 01/04/20  1756     History Chief Complaint  Patient presents with  . Altered Mental Status    Willie Ayala is a 39 y.o. male.  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  Presents to ER after aggressive episode at home.  Seen in emergency department at [facility] in the afternoon by [place].  Mother reports patient has had increased episodes of aggressive behavior.  Punched a wall continuously, locked in bathroom.  Sat on toilet for hours.  Labs were performed and demonstrated grossly normal BMP and CBC though noted mild leukocytosis.  X-ray of hand negative.  Plan to follow-up with neurology, psychiatry and PCP.  Mother reports after being discharged patient had another episode of demonstrating aggressive behavior, became agitated at home.  Did not hurt her or others, no self-harm.  After being taken to the emergency department he became calm.  No episodes of unresponsiveness, no seizure-like activity.  Mother reports patient is acting appropriately per his baseline.  Baseline nonverbal.  Mother reports patient on Zoloft per PCP, had been on Seroquel per psychiatry but this had been stopped recently.  HPI     Past Medical History:  Diagnosis Date  . Nonverbal   . 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 Cellulitis of left lower extremity 04/12/2017  . 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 Hyperglycemia 04/12/2017    History reviewed. No pertinent surgical history.     Family History  Problem Relation Age of Onset  . Parkinson's disease Father   . Cancer Maternal Grandmother   . Cancer Paternal Grandfather   . Hypertension Paternal Grandfather     Social History   Tobacco Use  . Smoking status: Never Smoker  . Smokeless tobacco: Never Used  Substance Use Topics  . Alcohol use: No  . Drug use: No      Home Medications Prior to Admission medications   Not on File    Allergies    Patient has no known allergies.  Review of Systems   Review of Systems  Unable to perform ROS: Patient nonverbal    Physical Exam Updated Vital Signs BP 114/79 (BP Location: Right Arm)   Pulse 90   Temp 98.7 F (37.1 C) (Oral)   Resp 20   Ht 5' 9"  (1.753 m)   Wt 59 kg   SpO2 98%   BMI 19.20 kg/m   Physical Exam Vitals and nursing note reviewed.  Constitutional:      Appearance: He is well-developed.  HENT:     Head: Normocephalic and atraumatic.  Eyes:     Conjunctiva/sclera: Conjunctivae normal.  Cardiovascular:     Rate and Rhythm: Normal rate and regular rhythm.     Heart sounds: No murmur.  Pulmonary:     Effort: Pulmonary effort is normal. No respiratory distress.     Breath sounds: Normal breath sounds.  Abdominal:     Palpations: Abdomen is soft.     Tenderness: There is no abdominal tenderness.  Musculoskeletal:     Cervical back: Neck supple.     Comments: RUE: Mild ecchymosis over right fourth PIP, normal ROM  Skin:    General: Skin is warm and dry.  Neurological:     Mental Status: He is alert.     Comments: Alert, moves all 4  extremities without difficulty, nonverbal     ED Results / Procedures / Treatments   Labs (all labs ordered are listed, but only abnormal results are displayed) Labs Reviewed - No data to display  EKG None  Radiology DG Hand Complete Right  Result Date: 01/04/2020 CLINICAL DATA:  Redness to entire right ring finger x2 days EXAM: RIGHT HAND - COMPLETE 3+ VIEW COMPARISON:  December 03, 2015 FINDINGS: There is no evidence of fracture or dislocation. There is no evidence of arthropathy or other focal bone abnormality. Soft tissues are unremarkable. IMPRESSION: Negative. Electronically Signed   By: Virgina Norfolk M.D.   On: 01/04/2020 16:45    Procedures Procedures (including critical care time)  Medications Ordered in  ED Medications  OLANZapine (ZYPREXA) tablet 10 mg (10 mg Oral Not Given 01/04/20 1947)    ED Course  I have reviewed the triage vital signs and the nursing notes.  Pertinent labs & imaging results that were available during my care of the patient were reviewed by me and considered in my medical decision making (see chart for details).    MDM Rules/Calculators/A&P                      39 year old male who presented to ER with complaint of aggressive behavior.  Here patient was very well appearing, very calm and cooperative.  Nonverbal baseline.  No seizure activity witnessed here or per report of mother.  Suspect these are primarily behavioral issues.  Given work-up completed earlier today, do not see need for additional medical work-up.  Recommend that mother continue the previously prescribed Seroquel as well as his previously prescribed Zoloft.  Recommend close follow-up in outpatient setting with psychiatry, PCP, neurology.    After the discussed management above, the patient was determined to be safe for discharge.  The patient was in agreement with this plan and all questions regarding their care were answered.  ED return precautions were discussed and the patient will return to the ED with any significant worsening of condition.   Final Clinical Impression(s) / ED Diagnoses Final diagnoses:  Agitation    Rx / DC Orders ED Discharge Orders    None       Lucrezia Starch, MD 01/05/20 1517

## 2020-08-29 ENCOUNTER — Ambulatory Visit: Payer: Self-pay | Admitting: Dentistry

## 2020-08-31 ENCOUNTER — Encounter (HOSPITAL_COMMUNITY): Payer: Self-pay | Admitting: *Deleted

## 2020-08-31 NOTE — Anesthesia Preprocedure Evaluation (Addendum)
Anesthesia Evaluation  Patient identified by MRN, date of birth, ID band Patient awake    Reviewed: Allergy & Precautions, NPO status , Patient's Chart, lab work & pertinent test results  History of Anesthesia Complications Negative for: history of anesthetic complications  Airway       Comment: Pt nonverbal, unable to open mouth on request Dental   Pulmonary neg pulmonary ROS,    Pulmonary exam normal        Cardiovascular negative cardio ROS Normal cardiovascular exam     Neuro/Psych negative neurological ROS  negative psych ROS   GI/Hepatic negative GI ROS, Neg liver ROS,   Endo/Other  negative endocrine ROS  Renal/GU negative Renal ROS  negative genitourinary   Musculoskeletal negative musculoskeletal ROS (+)   Abdominal   Peds 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; nonverbal   Hematology negative hematology ROS (+)   Anesthesia Other Findings   Reproductive/Obstetrics                        Anesthesia Physical Anesthesia Plan  ASA: III  Anesthesia Plan: General   Post-op Pain Management:    Induction: Intravenous  PONV Risk Score and Plan: 2 and Ondansetron, Dexamethasone and Treatment may vary due to age or medical condition  Airway Management Planned: Nasal ETT  Additional Equipment:   Intra-op Plan:   Post-operative Plan:   Informed Consent: I have reviewed the patients History and Physical, chart, labs and discussed the procedure including the risks, benefits and alternatives for the proposed anesthesia with the patient or authorized representative who has indicated his/her understanding and acceptance.     Dental advisory given  Plan Discussed with:   Anesthesia Plan Comments: (PAT note written 08/31/2020 by Myra Gianotti, PA-C. History includes chromosome 22 ring syndrome, lymphedema (BLE, L>R), hyperbilirubinemia. Non-verbal by records. H&P note  also lists hypotonia and "congenital soft palate" with no surgical history listed.  )      Anesthesia Quick Evaluation

## 2020-08-31 NOTE — Progress Notes (Signed)
Anesthesia Chart Review: Willie Ayala   Case: [id number] Date/Time: 09/01/20 0715   Procedure: DENTAL RESTORATION/EXTRACTION WITH X-RAY, CLEANING (N/A )   Anesthesia type: General   Pre-op diagnosis: DENTAL CARIES   Location: [facility] OR ROOM 05 / [facility]   Surgeons: Janae Bridgeman, DMD      DISCUSSION: Patient is a 39 year old male scheduled for the above procedure.  History includes chromosome 22 ring syndrome, lymphedema (BLE, L>R), hyperbilirubinemia. Non-verbal by records. H&P note also lists hypotonia and "congenital soft palate" with no surgical history listed.   H&P completed 08/22/20 by Bayard Beaver, PA-C. She wrote, "Pre-op evaluation Patient's most recent labs were reviewed and he was examined thoroughly. I do not see any reason for not proceeding with this dental procedure. In my opinion, some symptoms of irritability may resolve if this procedure can be completed. It is possible that irritation is secondary to oral pain. If no improvement postop, then will consider MRI or CT of the brain at that time."  He will need preoperative COVID-19 testing. Anesthesia team to evaluate on the day of surgery.    VS: Ht 5' 9"  (1.753 m)   Wt 63.3 kg   BMI 20.62 kg/m . Per H&P: BP 100/74, HR 56, RR 16.   PROVIDERS: [facility], Occupational hygienist [facility] (see [place]) Margaretmary Bayley, MD is GI [facility], see CE) Gwendalyn Ege, MD is psychiatrist ([place], see CE) Carmin Richmond, MD is neurologist [facility], see CE)   LABS: Currently, most recent labs results include: Lab Results  Component Value Date   WBC 14.4 (H) 01/04/2020   HGB 15.3 01/04/2020   HCT 47.0 01/04/2020   PLT 216 01/04/2020   GLUCOSE 76 01/04/2020   NA 141 01/04/2020   K 3.8 01/04/2020   CL 103 01/04/2020   CREATININE 0.83 01/04/2020   BUN 20 01/04/2020   CO2 22 01/04/2020   - As of 10/26/19 [facility] CE), CBC with diff WNL, CMET WNL except CO2 32 (23-30) and total bili  2.6 (0.1-1.2) which was felt "stable".    IMAGES: 1V CXR 01/30/20 [facility] CE): FINDINGS:  The heart size and mediastinal contours are within normal limits.  Both lungs are clear. The visualized skeletal structures are  unremarkable.  IMPRESSION:  No active disease.   [place] Abd (for evaluation hyperbilirubinemia) 08/06/19 [facility] CE): IMPRESSION:  Study within normal limits.      EKG: 01/29/20: Per Result Narrative [facility] CE): Ventricular Rate          57    BPM          Atrial Rate            57    BPM          P-R Interval            116    ms           QRS Duration            82    ms          Q-T Interval            444    ms           QTC                432    ms           P Axis  50    degrees        R Axis               78   degrees        T Axis               73    degrees         Sinus bradycardia  Left atrial enlargement  Otherwise normal  No previous ECGs available  Confirmed by [place] [phone]) on 01/30/2020 9:08:49 PM    CV: N/A  Past Medical History:  Diagnosis Date  . Hyperbilirubinemia   . Lymphedema   . Nonverbal   . 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 No pertinent surgical history.  MEDICATIONS: No current facility-administered medications for this encounter.   . Cholecalciferol 25 MCG (1000 UT) tablet  . OLANZapine zydis (ZYPREXA) 10 MG disintegrating tablet  . polyethylene glycol (MIRALAX / GLYCOLAX) 17 g packet    Myra Gianotti, PA-C Surgical Short Stay/Anesthesiology [facility] Phone [phone] [facility] Phone [phone] 08/31/2020 1:50 PM

## 2020-08-31 NOTE — Progress Notes (Signed)
Spoke with Mother Kylie Gros at [phone].   PCP -  Lodema Pilot, PA-C / Dr Loyal Jacobson Cardiologist - n/a  Chest x-ray - n/a EKG - n/a Stress Test - n/a ECHO - n/a Cardiac Cath - n/a  Anesthesia review: Yes  STOP now taking any Aspirin (unless otherwise instructed by your surgeon), Aleve, Naproxen, Ibuprofen, Motrin, Advil, Goody's, BC's, all herbal medications, fish oil, and all vitamins.   Coronavirus Screening Covid test on DOS Do you have any of the following symptoms:  Cough yes/no: No Fever (>100.24F)  yes/no: No Runny nose yes/no: No Sore throat yes/no: No Difficulty breathing/shortness of breath  yes/no: No  Have you traveled in the last 14 days and where? yes/no: No  Mother Lanora Manis verbalized understanding of instructions that were given via phone.

## 2020-09-01 ENCOUNTER — Encounter (HOSPITAL_COMMUNITY)
Admission: RE | Disposition: A | Discharge status: Home / Self Care | Payer: Self-pay | Source: Home / Self Care | Attending: Dentistry

## 2020-09-01 ENCOUNTER — Ambulatory Visit (HOSPITAL_COMMUNITY): Payer: Medicare Other | Admitting: Certified Registered Nurse Anesthetist

## 2020-09-01 ENCOUNTER — Ambulatory Visit (HOSPITAL_COMMUNITY)
Admission: RE | Admit: 2020-09-01 | Discharge: 2020-09-01 | Disposition: A | Discharge status: Home / Self Care | Payer: Medicare Other | Attending: Dentistry | Admitting: Dentistry

## 2020-09-01 ENCOUNTER — Encounter (HOSPITAL_COMMUNITY): Payer: Self-pay

## 2020-09-01 DIAGNOSIS — F79 Unspecified intellectual disabilities: Secondary | ICD-10-CM | POA: Diagnosis not present

## 2020-09-01 DIAGNOSIS — Z20822 Contact with and (suspected) exposure to covid-19: Secondary | ICD-10-CM | POA: Diagnosis not present

## 2020-09-01 DIAGNOSIS — K029 Dental caries, unspecified: Secondary | ICD-10-CM | POA: Diagnosis not present

## 2020-09-01 DIAGNOSIS — Q932 Chromosome replaced with ring, dicentric or isochromosome: Secondary | ICD-10-CM | POA: Insufficient documentation

## 2020-09-01 HISTORY — DX: Lymphedema, not elsewhere classified: I89.0

## 2020-09-01 HISTORY — PX: DENTAL RESTORATION/EXTRACTION WITH X-RAY: SHX5796

## 2020-09-01 HISTORY — DX: Other disorders of bilirubin metabolism: E80.6

## 2020-09-01 LAB — SARS CORONAVIRUS 2 BY RT PCR (HOSPITAL ORDER, PERFORMED IN ~~LOC~~ HOSPITAL LAB): SARS Coronavirus 2: NEGATIVE

## 2020-09-01 SURGERY — DENTAL RESTORATION/EXTRACTION WITH X-RAY
Anesthesia: General

## 2020-09-01 MED ORDER — PROPOFOL 10 MG/ML IV BOLUS
INTRAVENOUS | Status: DC | PRN
  Administered 2020-09-01: 200 mg via INTRAVENOUS

## 2020-09-01 MED ORDER — KETOROLAC TROMETHAMINE 30 MG/ML IJ SOLN
INTRAMUSCULAR | Status: DC | PRN
  Administered 2020-09-01: 30 mg via INTRAVENOUS

## 2020-09-01 MED ORDER — LIDOCAINE-EPINEPHRINE 2 %-1:100000 IJ SOLN
INTRAMUSCULAR | Status: AC
  Filled 2020-09-01: qty 3.4

## 2020-09-01 MED ORDER — PHENYLEPHRINE 40 MCG/ML (10ML) SYRINGE FOR IV PUSH (FOR BLOOD PRESSURE SUPPORT)
PREFILLED_SYRINGE | INTRAVENOUS | Status: DC | PRN
  Administered 2020-09-01: 40 ug via INTRAVENOUS

## 2020-09-01 MED ORDER — LIDOCAINE HCL 2 % IJ SOLN
INTRAMUSCULAR | Status: DC | PRN
  Administered 2020-09-01: 3.4 mL

## 2020-09-01 MED ORDER — OXYMETAZOLINE HCL 0.05 % NA SOLN
NASAL | Status: AC
  Filled 2020-09-01: qty 30

## 2020-09-01 MED ORDER — OXYMETAZOLINE HCL 0.05 % NA SOLN
NASAL | Status: DC | PRN
  Administered 2020-09-01: 1

## 2020-09-01 MED ORDER — 0.9 % SODIUM CHLORIDE (POUR BTL) OPTIME
TOPICAL | Status: DC | PRN
  Administered 2020-09-01: 1000 mL

## 2020-09-01 MED ORDER — PROPOFOL 10 MG/ML IV BOLUS
INTRAVENOUS | Status: AC
  Filled 2020-09-01: qty 40

## 2020-09-01 MED ORDER — FENTANYL CITRATE (PF) 100 MCG/2ML IJ SOLN: 25.0000 ug | INTRAMUSCULAR | Status: DC | PRN

## 2020-09-01 MED ORDER — MIDAZOLAM HCL 2 MG/2ML IJ SOLN
INTRAMUSCULAR | Status: AC
  Filled 2020-09-01: qty 2

## 2020-09-01 MED ORDER — FENTANYL CITRATE (PF) 250 MCG/5ML IJ SOLN
INTRAMUSCULAR | Status: AC
  Filled 2020-09-01: qty 5

## 2020-09-01 MED ORDER — FENTANYL CITRATE (PF) 100 MCG/2ML IJ SOLN
INTRAMUSCULAR | Status: DC | PRN
  Administered 2020-09-01 (×2): 50 ug via INTRAVENOUS

## 2020-09-01 MED ORDER — ONDANSETRON HCL 4 MG/2ML IJ SOLN: 4.0000 mg | Freq: Once | INTRAMUSCULAR | Status: DC | PRN

## 2020-09-01 MED ORDER — LACTATED RINGERS IV SOLN: INTRAVENOUS | Status: DC

## 2020-09-01 MED ORDER — ONDANSETRON HCL 4 MG/2ML IJ SOLN
INTRAMUSCULAR | Status: AC
  Filled 2020-09-01: qty 2

## 2020-09-01 MED ORDER — ORAL CARE MOUTH RINSE: 15.0000 mL | Freq: Once | OROMUCOSAL | Status: AC

## 2020-09-01 MED ORDER — ROCURONIUM BROMIDE 10 MG/ML (PF) SYRINGE
PREFILLED_SYRINGE | INTRAVENOUS | Status: AC
  Filled 2020-09-01: qty 10

## 2020-09-01 MED ORDER — SUCCINYLCHOLINE CHLORIDE 200 MG/10ML IV SOSY
PREFILLED_SYRINGE | INTRAVENOUS | Status: AC
  Filled 2020-09-01: qty 10

## 2020-09-01 MED ORDER — CHLORHEXIDINE GLUCONATE 0.12 % MT SOLN
OROMUCOSAL | Status: AC
  Filled 2020-09-01: qty 15

## 2020-09-01 MED ORDER — OXYCODONE HCL 5 MG/5ML PO SOLN: 5.0000 mg | Freq: Once | ORAL | Status: DC | PRN

## 2020-09-01 MED ORDER — SUGAMMADEX SODIUM 200 MG/2ML IV SOLN
INTRAVENOUS | Status: DC | PRN
  Administered 2020-09-01: 200 mg via INTRAVENOUS

## 2020-09-01 MED ORDER — OXYMETAZOLINE HCL 0.05 % NA SOLN
NASAL | Status: DC | PRN
  Administered 2020-09-01 (×3): 2 via NASAL

## 2020-09-01 MED ORDER — OXYCODONE HCL 5 MG PO TABS: 5.0000 mg | ORAL_TABLET | Freq: Once | ORAL | Status: DC | PRN

## 2020-09-01 MED ORDER — LIDOCAINE-EPINEPHRINE 2 %-1:100000 IJ SOLN
INTRAMUSCULAR | Status: AC
  Filled 2020-09-01: qty 1

## 2020-09-01 MED ORDER — ROCURONIUM BROMIDE 10 MG/ML (PF) SYRINGE
PREFILLED_SYRINGE | INTRAVENOUS | Status: DC | PRN
  Administered 2020-09-01: 30 mg via INTRAVENOUS
  Administered 2020-09-01 (×2): 10 mg via INTRAVENOUS
  Administered 2020-09-01: 70 mg via INTRAVENOUS

## 2020-09-01 MED ORDER — DEXAMETHASONE SODIUM PHOSPHATE 10 MG/ML IJ SOLN
INTRAMUSCULAR | Status: DC | PRN
  Administered 2020-09-01: 8 mg via INTRAVENOUS

## 2020-09-01 MED ORDER — PHENYLEPHRINE 40 MCG/ML (10ML) SYRINGE FOR IV PUSH (FOR BLOOD PRESSURE SUPPORT)
PREFILLED_SYRINGE | INTRAVENOUS | Status: AC
  Filled 2020-09-01: qty 10

## 2020-09-01 MED ORDER — AMISULPRIDE (ANTIEMETIC) 5 MG/2ML IV SOLN: 10.0000 mg | Freq: Once | INTRAVENOUS | Status: DC | PRN

## 2020-09-01 MED ORDER — ONDANSETRON HCL 4 MG/2ML IJ SOLN
INTRAMUSCULAR | Status: DC | PRN
  Administered 2020-09-01: 4 mg via INTRAVENOUS

## 2020-09-01 MED ORDER — CHLORHEXIDINE GLUCONATE 0.12 % MT SOLN
15.0000 mL | Freq: Once | OROMUCOSAL | Status: AC
  Administered 2020-09-01: 15 mL via OROMUCOSAL
  Filled 2020-09-01: qty 15

## 2020-09-01 MED ORDER — CEFAZOLIN SODIUM-DEXTROSE 2-3 GM-%(50ML) IV SOLR
INTRAVENOUS | Status: DC | PRN
  Administered 2020-09-01: 2 g via INTRAVENOUS

## 2020-09-01 MED ORDER — LIDOCAINE 2% (20 MG/ML) 5 ML SYRINGE
INTRAMUSCULAR | Status: AC
  Filled 2020-09-01: qty 5

## 2020-09-01 MED ORDER — DEXAMETHASONE SODIUM PHOSPHATE 10 MG/ML IJ SOLN
INTRAMUSCULAR | Status: AC
  Filled 2020-09-01: qty 1

## 2020-09-01 MED ORDER — EPHEDRINE 5 MG/ML INJ
INTRAVENOUS | Status: AC
  Filled 2020-09-01: qty 10

## 2020-09-01 MED ORDER — LIDOCAINE 2% (20 MG/ML) 5 ML SYRINGE
INTRAMUSCULAR | Status: DC | PRN
  Administered 2020-09-01: 60 mg via INTRAVENOUS

## 2020-09-01 SURGICAL SUPPLY — 25 items
BLADE SURG 15 STRL LF DISP TIS (BLADE) ×1 IMPLANT
BLADE SURG 15 STRL SS (BLADE) ×2
CANISTER SUCT 3000ML PPV (MISCELLANEOUS) ×3 IMPLANT
COVER BACK TABLE 60X90IN (DRAPES) ×3 IMPLANT
COVER MAYO STAND STRL (DRAPES) ×3 IMPLANT
COVER SURGICAL LIGHT HANDLE (MISCELLANEOUS) ×3 IMPLANT
DRAPE HALF SHEET 40X57 (DRAPES) ×3 IMPLANT
GAUZE SPONGE 4X4 16PLY XRAY LF (GAUZE/BANDAGES/DRESSINGS) ×3 IMPLANT
GLOVE BIO SURGEON STRL SZ 6.5 (GLOVE) ×2 IMPLANT
GLOVE BIO SURGEONS STRL SZ 6.5 (GLOVE) ×1
GOWN STRL REUS W/ TWL LRG LVL3 (GOWN DISPOSABLE) ×2 IMPLANT
GOWN STRL REUS W/TWL LRG LVL3 (GOWN DISPOSABLE) ×4
KIT BASIN OR (CUSTOM PROCEDURE TRAY) ×3 IMPLANT
KIT TURNOVER KIT B (KITS) ×3 IMPLANT
NDL DENTAL 27 LONG (NEEDLE) ×1 IMPLANT
NEEDLE DENTAL 27 LONG (NEEDLE) ×3 IMPLANT
PAD ARMBOARD 7.5X6 YLW CONV (MISCELLANEOUS) ×6 IMPLANT
SPONGE SURGIFOAM ABS GEL SZ50 (HEMOSTASIS) ×2 IMPLANT
SUT CHROMIC 3 0 PS 2 (SUTURE) ×3 IMPLANT
TOOTHBRUSH ADULT (PERSONAL CARE ITEMS) ×3 IMPLANT
TOWEL GREEN STERILE (TOWEL DISPOSABLE) ×3 IMPLANT
TUBE CONNECTING 12'X1/4 (SUCTIONS) ×1
TUBE CONNECTING 12X1/4 (SUCTIONS) ×2 IMPLANT
WATER TABLETS ICX (MISCELLANEOUS) ×3 IMPLANT
YANKAUER SUCT BULB TIP NO VENT (SUCTIONS) ×3 IMPLANT

## 2020-09-01 NOTE — Brief Op Note (Addendum)
09/01/2020  11:10 AM  PATIENT:  Willie Ayala  39 y.o. male  PRE-OPERATIVE DIAGNOSIS:  DENTAL CARIES  POST-OPERATIVE DIAGNOSIS:  DENTAL CARIES  PROCEDURE:  Procedure(s): DENTAL RESTORATION/EXTRACTION WITH X-RAY, CLEANING (N/A)  Extractions on teeth #: 2, 4,7,9a,10,14,19,22,24,30 Fillings on teeth #: , , , 18O, 20DO, 31O SURGEON:  Surgeon(s) and Role:    * Brighten Orndoff H, DMD - Primary  PHYSICIAN ASSISTANT:   ASSISTANTS: Ruben Gottron and hospital staff   ANESTHESIA:   general  EBL:  50 mL   BLOOD ADMINISTERED:none  DRAINS: none   LOCAL MEDICATIONS USED:  LIDOCAINE 1.7ML  SPECIMEN:  No Specimen  DISPOSITION OF SPECIMEN:  N/A  COUNTS:  YES  TOURNIQUET:  * No tourniquets in log *  DICTATION: .Dragon Dictation  PLAN OF CARE: Discharge to home after PACU  PATIENT DISPOSITION:  PACU - hemodynamically stable.   Delay start of Pharmacological VTE agent (>24hrs) due to surgical blood loss or risk of bleeding: not applicable

## 2020-09-01 NOTE — Anesthesia Procedure Notes (Signed)
Procedure Name: Intubation Date/Time: 09/01/2020 8:08 AM Performed by: Lowella Dell, CRNA Pre-anesthesia Checklist: Patient identified, Emergency Drugs available, Suction available and Patient being monitored Patient Re-evaluated:Patient Re-evaluated prior to induction Oxygen Delivery Method: Circle system utilized Preoxygenation: Pre-oxygenation with 100% oxygen Induction Type: IV induction Ventilation: Mask ventilation without difficulty Laryngoscope Size: Mac and Glidescope (Mac 4, GrI view. Utilized glidescope for assist with Dwyane Luo advancement) Grade View: Grade I Nasal Tubes: Nasal prep performed and Nasal Rae Tube size: 7.5 mm Number of attempts: 1 Airway Equipment and Method: Video-laryngoscopy Placement Confirmation: ETT inserted through vocal cords under direct vision,  positive ETCO2 and breath sounds checked- equal and bilateral Secured at: 28 cm Tube secured with: Tape Dental Injury: Teeth and Oropharynx as per pre-operative assessment

## 2020-09-01 NOTE — Anesthesia Postprocedure Evaluation (Signed)
Anesthesia Post Note  Patient: Willie Ayala  Procedure(s) Performed: DENTAL RESTORATION/EXTRACTION WITH X-RAY, CLEANING (N/A )     Patient location during evaluation: PACU Anesthesia Type: General Level of consciousness: awake and alert Pain management: pain level controlled Vital Signs Assessment: post-procedure vital signs reviewed and stable Respiratory status: spontaneous breathing, nonlabored ventilation and respiratory function stable Cardiovascular status: blood pressure returned to baseline and stable Postop Assessment: no apparent nausea or vomiting Anesthetic complications: no   No complications documented.  Last Vitals:  Vitals:   09/01/20 1137 09/01/20 1145  BP: 111/73 116/80  Pulse: 75 77  Resp: 10 11  Temp:  36.7 C  SpO2: 96% 97%    Last Pain:  Vitals:   09/01/20 1145  TempSrc:   PainSc: 0-No pain                 Lucretia Kern

## 2020-09-01 NOTE — Transfer of Care (Signed)
Immediate Anesthesia Transfer of Care Note  Patient: Willie Ayala  Procedure(s) Performed: DENTAL RESTORATION/EXTRACTION WITH X-RAY, CLEANING (N/A )  Patient Location: PACU  Anesthesia Type:General  Level of Consciousness: awake and patient cooperative  Airway & Oxygen Therapy: Patient Spontanous Breathing and Patient connected to face mask oxygen  Post-op Assessment: Report given to RN and Post -op Vital signs reviewed and stable  Post vital signs: Reviewed and stable  Last Vitals:  Vitals Value Taken Time  BP 117/82 09/01/20 1122  Temp 37.1 C 09/01/20 1122  Pulse 72 09/01/20 1122  Resp 12 09/01/20 1122  SpO2 98 % 09/01/20 1122    Last Pain:  Vitals:   09/01/20 0628  TempSrc:   PainSc: 0-No pain         Complications: No complications documented.

## 2020-09-01 NOTE — H&P (Signed)
H&P reviewed. Stable for surgery Willie Ayala [name], DMD  

## 2020-09-01 NOTE — Op Note (Signed)
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 Dental Care provided in OR for medically necessary treatment.  Surgeon: Joanna Hews, DMD  Assistant: Ruben Gottron and hospital staff.  Anesthesia: General  Procedure: The patient was brought into the operating room and placed on the table in a supine position.  General anesthesia was administered via nasal intubation.  The patient was prepped and draped in the usual manner for an intra-oral general dentistry procedure. The oropharynx was suctioned and a moistened oropharyngeal throat pack was placed.    A full intra-oral exam including all hard and soft tissues was performed.  Type of Exam: Recall   Soft Tissue Exam: Floor of the mouth: Normal Buccal mucosa: Normal Soft palate: Normal Hard palate: Normal Tongue: Normal Gingival: Normal Frenum: Normal  Hard tissue exam:  Present: # 1-24, 27-32, supernumerary 9a, 17a Missing: # 25, 26 Impacted: # 1,16, 17, 32, supernumerary 17a Radiographic findings decay: # 60M, 27M,  Radiographic findings abscess: # 4  Full mouth series of digital radiographs taken and reviewed. A comprehensive treatment plan was developed.  Operative care was accomplished in a standard fashion using high/low speed drills with copious irrigation.  Routine extractions were accomplished with simple elevation and use of forceps. Surgical Extractions were done in a standard fashion with full facial thickness flaps to gain access, otectomy / osteoplasty with copious irrigation to expose the teeth. Teeth with multi-roots were sectioned as needed to minimize surgical trauma.  All surgical sites were irrigated with copious amounts of saline. Gel foam was placed in the sockets and hemostasis established with firm pressure. Surgical sites were closed with 3-0 Chromic sutures.  Local Anes:Lidocaine 2%  with 1:100,000 epinephrine 1.7 mls The estimated blood loss was 50 mls.   Upon completion of all procedures the oropharynx was irrigated of all debris. Mouth was suctioned dry and a posterior throat pack was carefully removed with constant suction. Hemostasis was established and a gauze pack was placed as an intraoral pressure dressing. After spontaneous respirations the patient was extubated and transported to the Post-Anesthesia care unit in awake but in a sedated condition. The patient tolerated the procedure well and without complications.  An explanation of procedures and extractions were given to Mother.  Operative Procedures:  Full mouth debridement: Yes Extractions completed: # 2,4,7,9a,10,14,19,22,24,30 Composites/Glass Ionomers: # 60MO(coronal/chewing/dentin), (coronal/chewing/dentin), 15 MO(coronal/chewing/dentin), 18 O(coronal/chewing/dentin), 20 DO(coronal/chewing/dentin), 31 O(coronal/chewing/dentin)  Postoperative Meds:  OTC ibuprofen 600mg  and tylenol 500mg  every 6 hours if needed for pain  Postoperative Instructions: Extraction sheet signed and given to patient representative.    , DMD

## 2020-09-02 ENCOUNTER — Encounter (HOSPITAL_COMMUNITY): Payer: Self-pay | Admitting: Dentistry

## 2021-08-21 IMAGING — DX DG HAND COMPLETE 3+V*R*
3 series · 3 of 3 positions shown · non-contrast
Comparison: December 03, 2015

CLINICAL DATA: Redness to entire right ring finger x2 days

EXAM:
RIGHT HAND - COMPLETE 3+ VIEW

[hand ap]
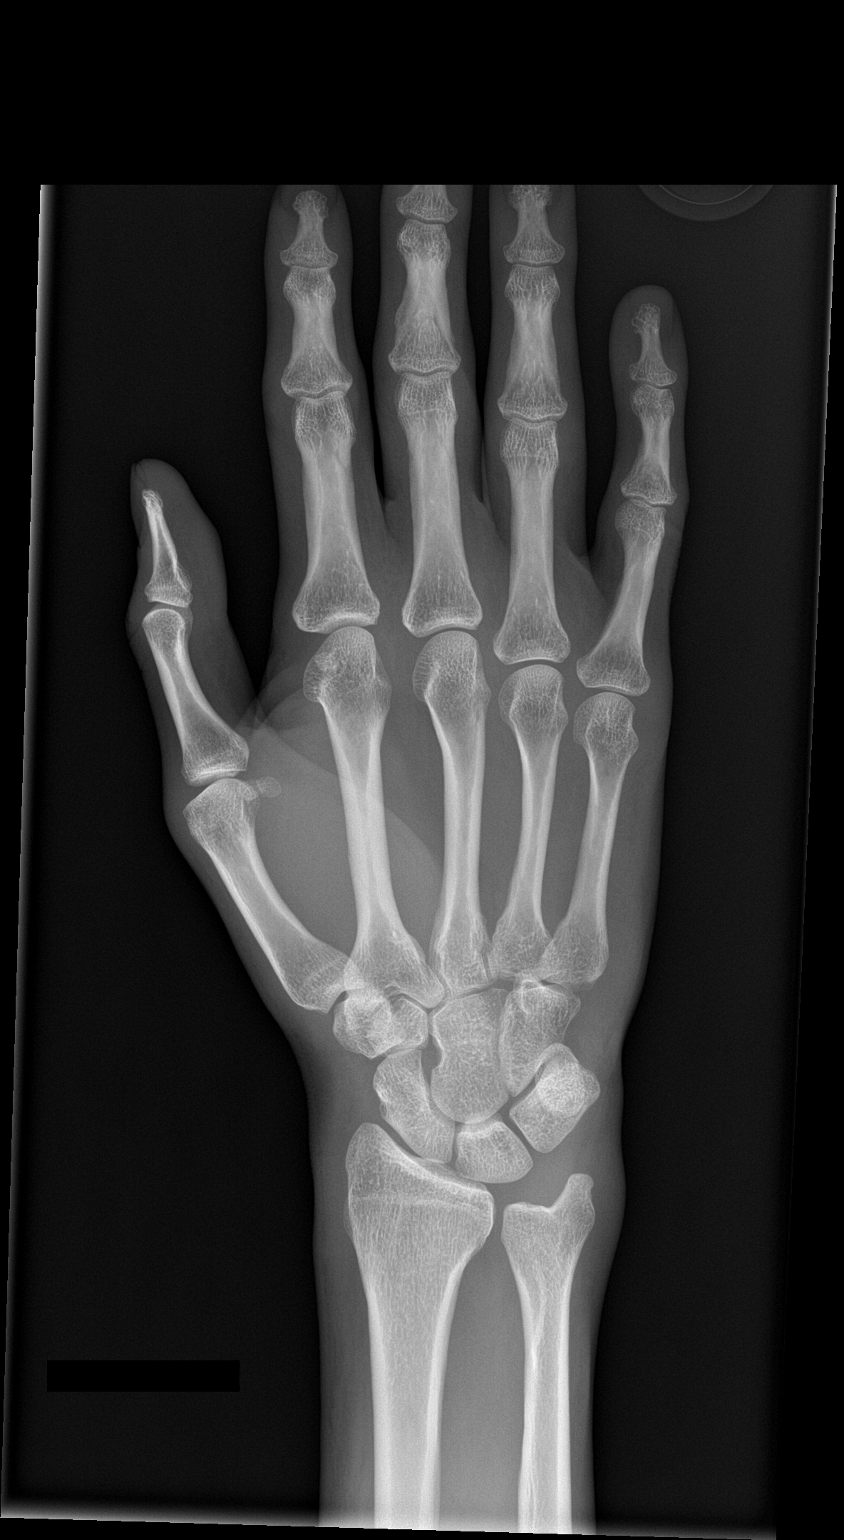

[hand obl]
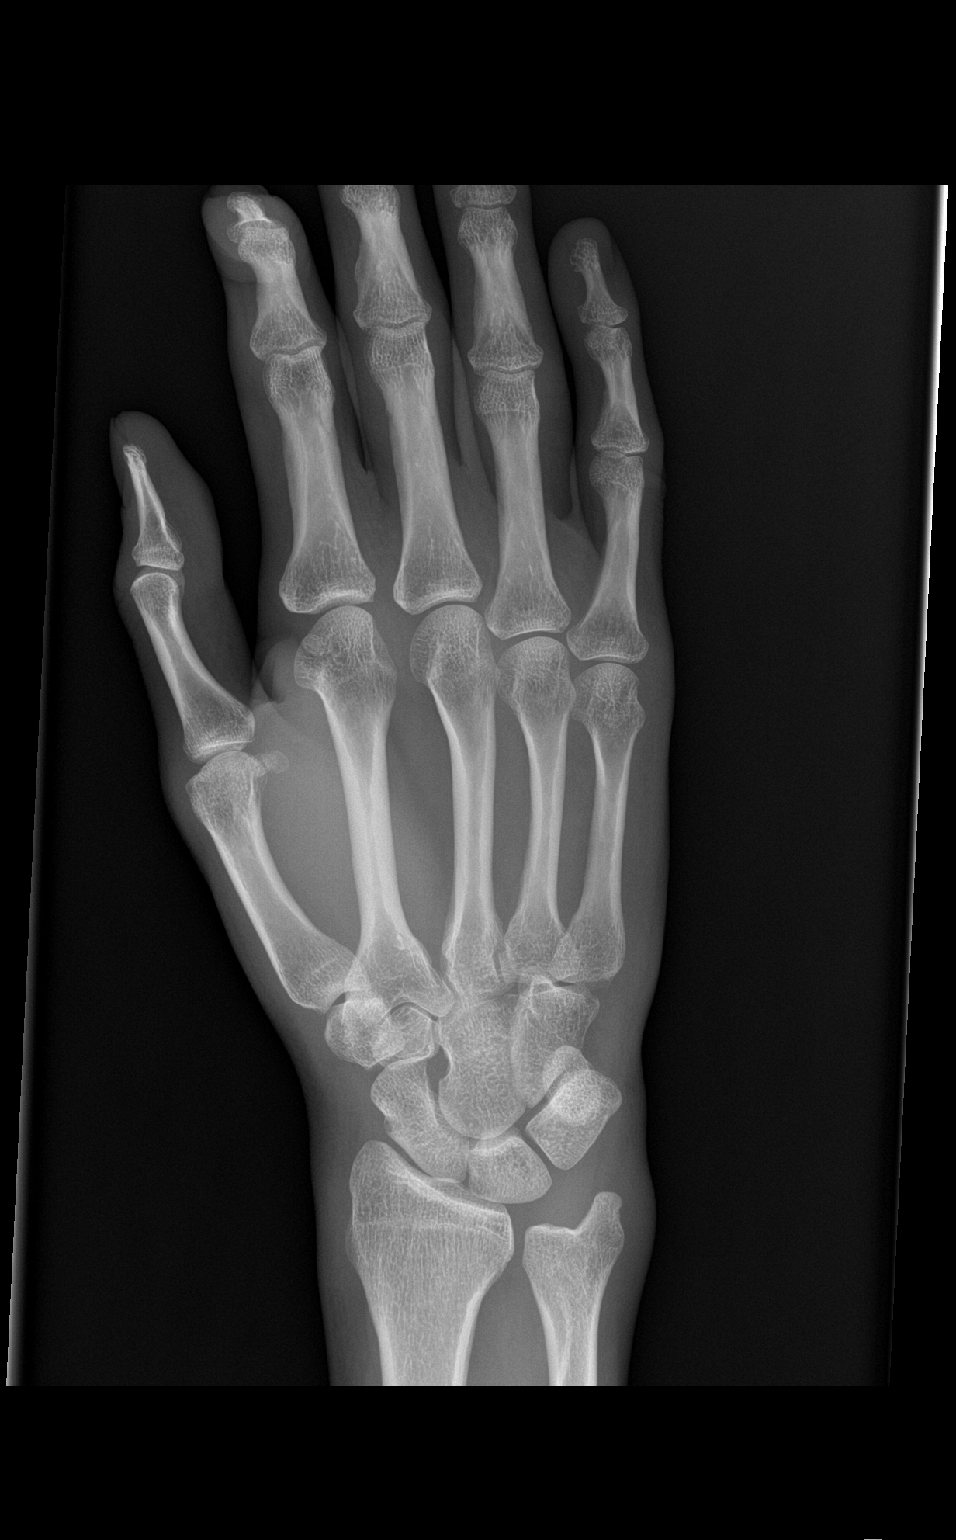

[hand lat]
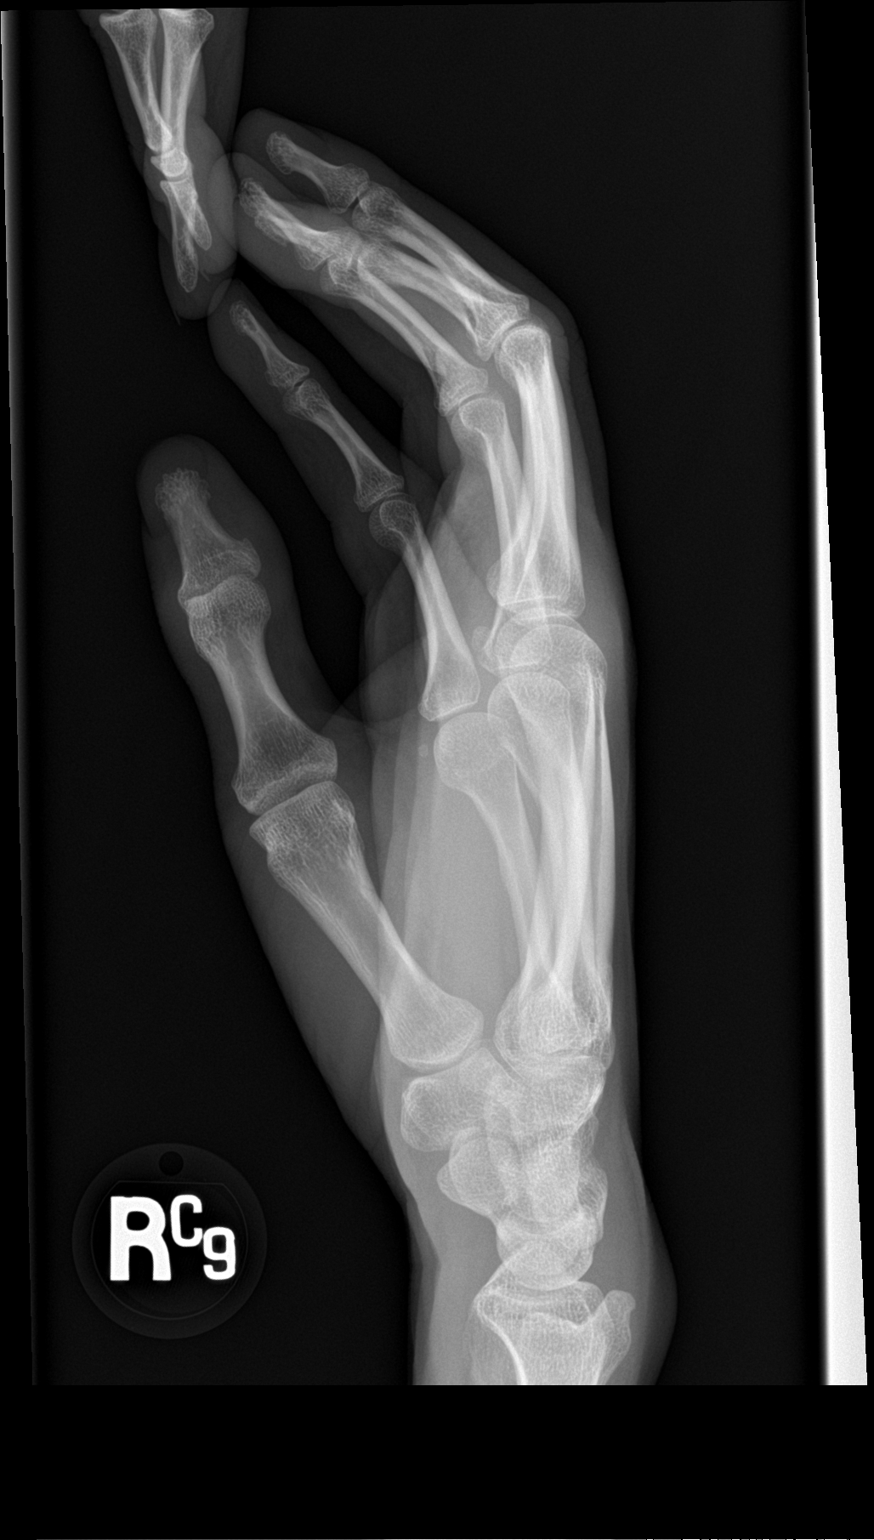

[3 of 3 positions shown; findings below may reference images not displayed]

FINDINGS: There is no evidence of fracture or dislocation. There is no
evidence of arthropathy or other focal bone abnormality. Soft
tissues are unremarkable.
IMPRESSION: Negative.

## 2024-12-09 ENCOUNTER — Ambulatory Visit: Admitting: Psychology

## 2024-12-14 ENCOUNTER — Other Ambulatory Visit: Admitting: Psychology

## 2024-12-16 ENCOUNTER — Other Ambulatory Visit: Admitting: Psychology

## 2024-12-23 ENCOUNTER — Ambulatory Visit: Admitting: Psychology
